# Patient Record
Sex: Female | Born: 1975 | Race: Black or African American | Hispanic: No | Marital: Single | Smoking: Never smoker
Health system: Southern US, Community
[De-identification: ages and names within clinical notes are randomized; demographics above are authoritative.]

## PROBLEM LIST (undated history)

## (undated) DIAGNOSIS — M1990 Unspecified osteoarthritis, unspecified site: Secondary | ICD-10-CM

## (undated) DIAGNOSIS — I10 Essential (primary) hypertension: Secondary | ICD-10-CM

## (undated) DIAGNOSIS — G4730 Sleep apnea, unspecified: Secondary | ICD-10-CM

## (undated) DIAGNOSIS — M549 Dorsalgia, unspecified: Secondary | ICD-10-CM

## (undated) DIAGNOSIS — M2550 Pain in unspecified joint: Secondary | ICD-10-CM

## (undated) DIAGNOSIS — R7303 Prediabetes: Secondary | ICD-10-CM

## (undated) HISTORY — DX: Essential (primary) hypertension: I10

## (undated) HISTORY — DX: Pain in unspecified joint: M25.50

## (undated) HISTORY — PX: DIAGNOSTIC LAPAROSCOPY: SUR761

## (undated) HISTORY — DX: Dorsalgia, unspecified: M54.9

## (undated) HISTORY — PX: BREAST REDUCTION SURGERY: SHX8

## (undated) HISTORY — DX: Prediabetes: R73.03

## (undated) HISTORY — PX: MOUTH SURGERY: SHX715

## (undated) HISTORY — PX: WISDOM TOOTH EXTRACTION: SHX21

---

## 1997-11-09 ENCOUNTER — Inpatient Hospital Stay (HOSPITAL_COMMUNITY): Admission: AD | Admit: 1997-11-09 | Discharge: 1997-11-09 | Payer: Self-pay | Admitting: *Deleted

## 1998-03-04 ENCOUNTER — Inpatient Hospital Stay (HOSPITAL_COMMUNITY): Admission: AD | Admit: 1998-03-04 | Discharge: 1998-03-04 | Payer: Self-pay | Admitting: Obstetrics

## 1998-06-12 ENCOUNTER — Inpatient Hospital Stay (HOSPITAL_COMMUNITY): Admission: AD | Admit: 1998-06-12 | Discharge: 1998-06-12 | Payer: Self-pay | Admitting: Obstetrics & Gynecology

## 1998-11-27 ENCOUNTER — Encounter: Payer: Self-pay | Admitting: Emergency Medicine

## 1998-11-27 ENCOUNTER — Emergency Department (HOSPITAL_COMMUNITY): Admission: EM | Admit: 1998-11-27 | Discharge: 1998-11-27 | Payer: Self-pay | Admitting: Emergency Medicine

## 1998-11-29 ENCOUNTER — Emergency Department (HOSPITAL_COMMUNITY): Admission: EM | Admit: 1998-11-29 | Discharge: 1998-11-29 | Payer: Self-pay | Admitting: Emergency Medicine

## 1999-01-07 ENCOUNTER — Inpatient Hospital Stay (HOSPITAL_COMMUNITY): Admission: AD | Admit: 1999-01-07 | Discharge: 1999-01-07 | Payer: Self-pay | Admitting: Obstetrics & Gynecology

## 1999-04-22 ENCOUNTER — Emergency Department (HOSPITAL_COMMUNITY): Admission: EM | Admit: 1999-04-22 | Discharge: 1999-04-22 | Payer: Self-pay | Admitting: Emergency Medicine

## 1999-04-22 ENCOUNTER — Encounter: Payer: Self-pay | Admitting: Emergency Medicine

## 1999-04-24 ENCOUNTER — Emergency Department (HOSPITAL_COMMUNITY): Admission: EM | Admit: 1999-04-24 | Discharge: 1999-04-24 | Payer: Self-pay | Admitting: Emergency Medicine

## 1999-04-28 ENCOUNTER — Emergency Department (HOSPITAL_COMMUNITY): Admission: EM | Admit: 1999-04-28 | Discharge: 1999-04-29 | Payer: Self-pay | Admitting: Emergency Medicine

## 1999-04-28 ENCOUNTER — Encounter: Payer: Self-pay | Admitting: Emergency Medicine

## 1999-09-26 ENCOUNTER — Inpatient Hospital Stay (HOSPITAL_COMMUNITY): Admission: AD | Admit: 1999-09-26 | Discharge: 1999-09-26 | Payer: Self-pay | Admitting: Obstetrics & Gynecology

## 2000-03-03 ENCOUNTER — Emergency Department (HOSPITAL_COMMUNITY): Admission: EM | Admit: 2000-03-03 | Discharge: 2000-03-03 | Payer: Self-pay | Admitting: Emergency Medicine

## 2000-11-08 ENCOUNTER — Emergency Department (HOSPITAL_COMMUNITY): Admission: EM | Admit: 2000-11-08 | Discharge: 2000-11-08 | Payer: Self-pay | Admitting: Emergency Medicine

## 2001-06-07 ENCOUNTER — Inpatient Hospital Stay (HOSPITAL_COMMUNITY): Admission: AD | Admit: 2001-06-07 | Discharge: 2001-06-07 | Payer: Self-pay | Admitting: Obstetrics & Gynecology

## 2001-10-14 ENCOUNTER — Emergency Department (HOSPITAL_COMMUNITY): Admission: EM | Admit: 2001-10-14 | Discharge: 2001-10-14 | Payer: Self-pay | Admitting: Emergency Medicine

## 2001-10-14 ENCOUNTER — Encounter: Payer: Self-pay | Admitting: Emergency Medicine

## 2002-04-01 ENCOUNTER — Emergency Department (HOSPITAL_COMMUNITY): Admission: EM | Admit: 2002-04-01 | Discharge: 2002-04-01 | Payer: Self-pay | Admitting: Emergency Medicine

## 2002-08-29 HISTORY — PX: SALPINGECTOMY: SHX328

## 2002-12-09 ENCOUNTER — Inpatient Hospital Stay (HOSPITAL_COMMUNITY): Admission: AD | Admit: 2002-12-09 | Discharge: 2002-12-09 | Payer: Self-pay | Admitting: Obstetrics and Gynecology

## 2002-12-10 ENCOUNTER — Encounter: Payer: Self-pay | Admitting: Obstetrics and Gynecology

## 2002-12-30 ENCOUNTER — Ambulatory Visit (HOSPITAL_COMMUNITY): Admission: RE | Admit: 2002-12-30 | Discharge: 2002-12-30 | Payer: Self-pay | Admitting: *Deleted

## 2002-12-30 ENCOUNTER — Encounter: Payer: Self-pay | Admitting: *Deleted

## 2003-01-24 ENCOUNTER — Ambulatory Visit (HOSPITAL_COMMUNITY): Admission: RE | Admit: 2003-01-24 | Discharge: 2003-01-24 | Payer: Self-pay | Admitting: *Deleted

## 2003-01-24 ENCOUNTER — Encounter (INDEPENDENT_AMBULATORY_CARE_PROVIDER_SITE_OTHER): Payer: Self-pay

## 2003-05-30 ENCOUNTER — Encounter: Payer: Self-pay | Admitting: Emergency Medicine

## 2003-05-30 ENCOUNTER — Emergency Department (HOSPITAL_COMMUNITY): Admission: EM | Admit: 2003-05-30 | Discharge: 2003-05-30 | Payer: Self-pay | Admitting: Emergency Medicine

## 2003-10-04 ENCOUNTER — Inpatient Hospital Stay (HOSPITAL_COMMUNITY): Admission: AD | Admit: 2003-10-04 | Discharge: 2003-10-04 | Payer: Self-pay | Admitting: Obstetrics & Gynecology

## 2004-01-26 ENCOUNTER — Inpatient Hospital Stay (HOSPITAL_COMMUNITY): Admission: AD | Admit: 2004-01-26 | Discharge: 2004-01-26 | Payer: Self-pay | Admitting: Obstetrics and Gynecology

## 2005-11-15 ENCOUNTER — Emergency Department (HOSPITAL_COMMUNITY): Admission: EM | Admit: 2005-11-15 | Discharge: 2005-11-15 | Payer: Self-pay | Admitting: Emergency Medicine

## 2006-12-26 ENCOUNTER — Emergency Department (HOSPITAL_COMMUNITY): Admission: EM | Admit: 2006-12-26 | Discharge: 2006-12-26 | Payer: Self-pay | Admitting: Emergency Medicine

## 2007-02-27 ENCOUNTER — Emergency Department (HOSPITAL_COMMUNITY): Admission: EM | Admit: 2007-02-27 | Discharge: 2007-02-27 | Payer: Self-pay | Admitting: Emergency Medicine

## 2007-03-19 ENCOUNTER — Emergency Department (HOSPITAL_COMMUNITY): Admission: EM | Admit: 2007-03-19 | Discharge: 2007-03-20 | Payer: Self-pay | Admitting: Emergency Medicine

## 2007-07-05 ENCOUNTER — Emergency Department (HOSPITAL_COMMUNITY): Admission: EM | Admit: 2007-07-05 | Discharge: 2007-07-05 | Payer: Self-pay | Admitting: Emergency Medicine

## 2007-07-11 ENCOUNTER — Inpatient Hospital Stay (HOSPITAL_COMMUNITY): Admission: AD | Admit: 2007-07-11 | Discharge: 2007-07-12 | Payer: Self-pay | Admitting: Obstetrics & Gynecology

## 2007-08-02 ENCOUNTER — Emergency Department (HOSPITAL_COMMUNITY): Admission: EM | Admit: 2007-08-02 | Discharge: 2007-08-02 | Payer: Self-pay | Admitting: Emergency Medicine

## 2007-08-06 ENCOUNTER — Encounter: Admission: RE | Admit: 2007-08-06 | Discharge: 2007-08-06 | Payer: Self-pay | Admitting: Family Medicine

## 2007-09-23 ENCOUNTER — Emergency Department (HOSPITAL_COMMUNITY): Admission: EM | Admit: 2007-09-23 | Discharge: 2007-09-23 | Payer: Self-pay | Admitting: Family Medicine

## 2008-01-02 ENCOUNTER — Emergency Department (HOSPITAL_COMMUNITY): Admission: EM | Admit: 2008-01-02 | Discharge: 2008-01-02 | Payer: Self-pay | Admitting: Family Medicine

## 2008-06-14 IMAGING — MG MM DIAGNOSTIC UNILATERAL L
7 series · 7 of 7 positions shown · non-contrast
Comparison: none

DG DIAGNOSTIC BILATERAL
Bilateral CC and MLO view(s) were taken.

LEFT BREAST ULTRASOUND
DIGITAL BILATERAL  DIAGNOSTIC MAMMOGRAM WITH CAD AND LEFT BREAST ULTRASOUND:
CLINICAL DATA: Questionable mass, left breast.  Previous breast reduction surgery.

[L CC]
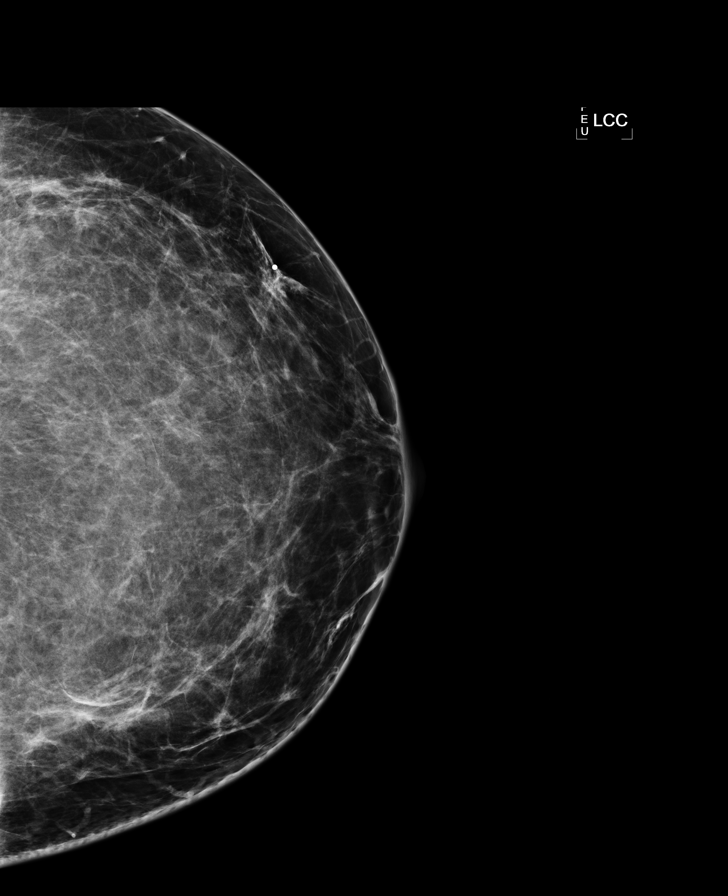

[L MLO (1 of 2)]
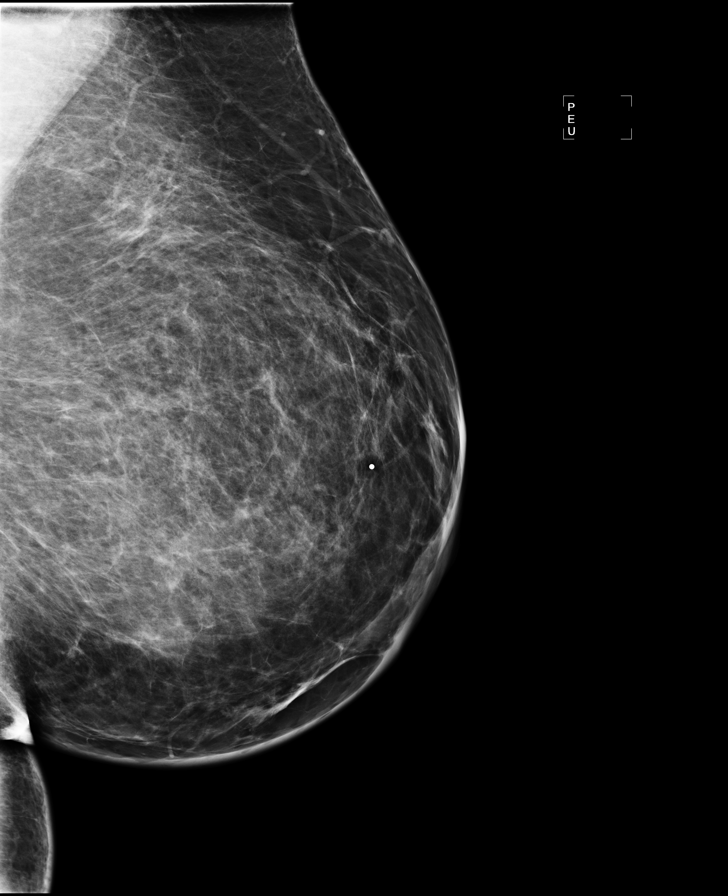

[R CC]
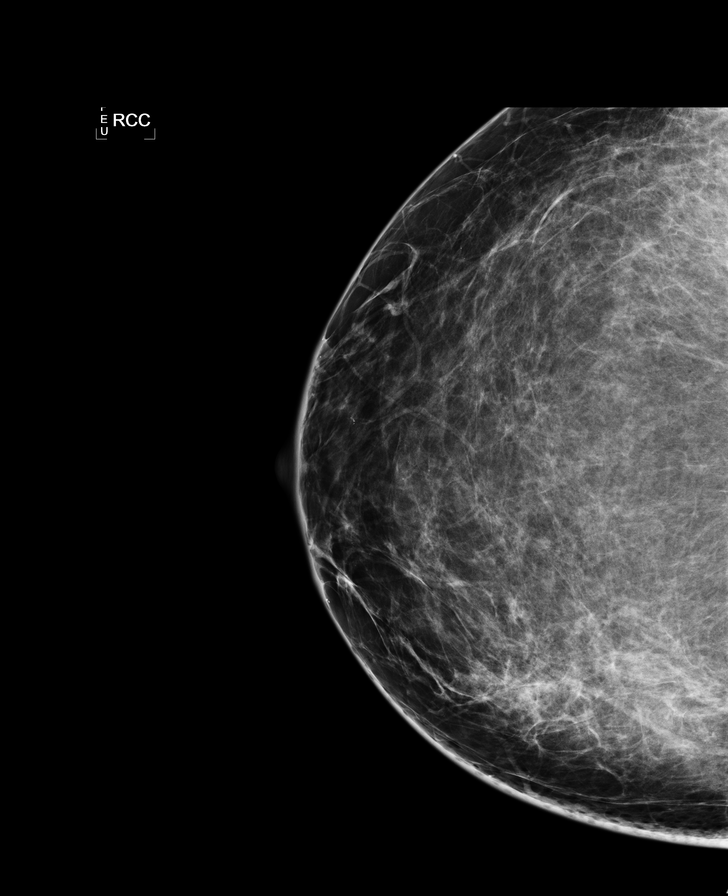

[R MLO (1 of 2)]
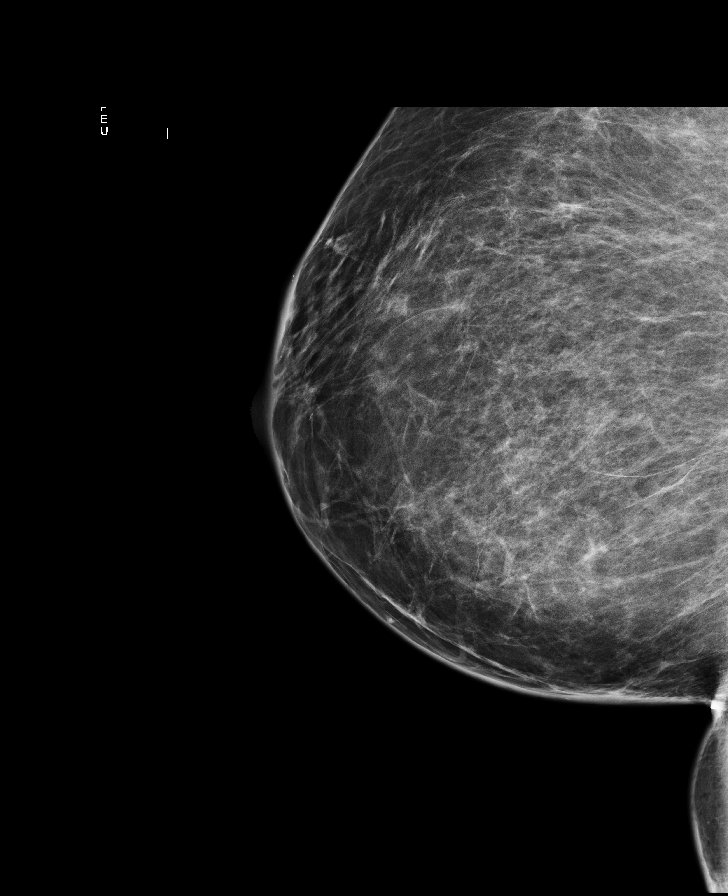

[L TAN]
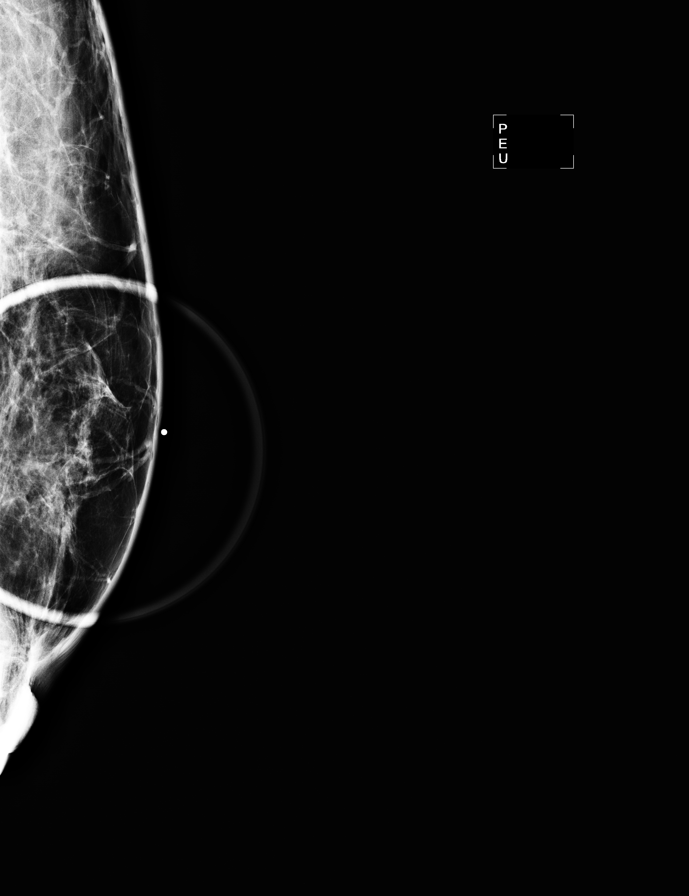

[L MLO (2 of 2)]
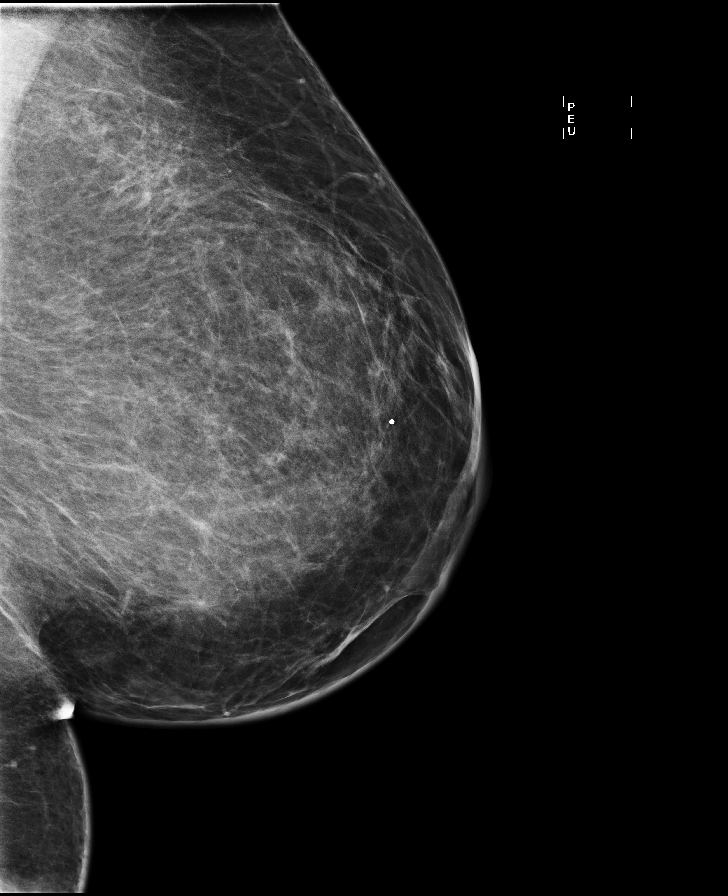

[R MLO (2 of 2)]
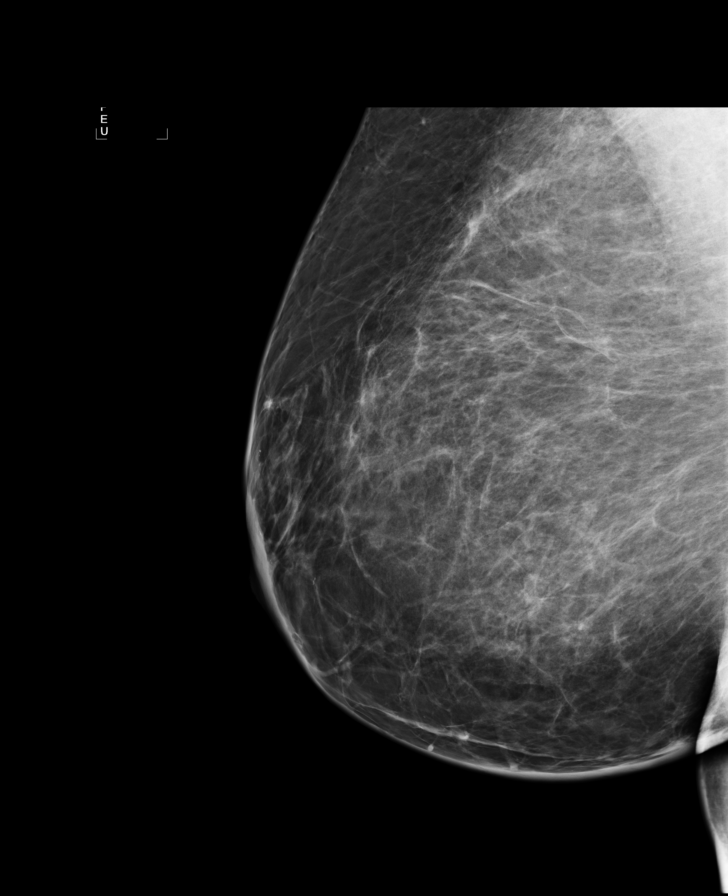

[7 of 7 positions shown; findings below may reference images not displayed]

There is a fibrofatty parenchymal pattern present.  There are mild scarring changes within the 
breast related to the patient's previous breast reduction surgery.  There is no evidence for a mass
and there are no suspicious microcalcifications.

On my directed physical examination of the area of questionable palpable abnormality within the 
left breast located approximately at the 3-4 o'clock position there was no discrete palpable mass. 
Ultrasound demonstrates no mass, cyst, distortion or abnormal acoustic shadowing in this region.
IMPRESSION: No specific evidence for malignancy.  Recommend annual screening mammography at age 41.  The 
importance of breast self examination was stressed with the patient.

ASSESSMENT: Negative - BI-RADS 1

Routine screening mammogram at age 41.
ANALYZED BY COMPUTER AIDED DETECTION. ,

## 2009-05-14 ENCOUNTER — Emergency Department (HOSPITAL_COMMUNITY): Admission: EM | Admit: 2009-05-14 | Discharge: 2009-05-14 | Payer: Self-pay | Admitting: Emergency Medicine

## 2009-06-24 ENCOUNTER — Other Ambulatory Visit: Admission: RE | Admit: 2009-06-24 | Discharge: 2009-06-24 | Payer: Self-pay | Admitting: *Deleted

## 2009-09-25 ENCOUNTER — Encounter: Admission: RE | Admit: 2009-09-25 | Discharge: 2009-09-25 | Payer: Self-pay | Admitting: *Deleted

## 2009-11-30 ENCOUNTER — Inpatient Hospital Stay (HOSPITAL_COMMUNITY): Admission: AD | Admit: 2009-11-30 | Discharge: 2009-11-30 | Payer: Self-pay | Admitting: Obstetrics and Gynecology

## 2010-04-15 ENCOUNTER — Other Ambulatory Visit: Admission: RE | Admit: 2010-04-15 | Discharge: 2010-04-15 | Payer: Self-pay | Admitting: Obstetrics and Gynecology

## 2010-10-03 ENCOUNTER — Emergency Department (HOSPITAL_COMMUNITY)
Admission: EM | Admit: 2010-10-03 | Discharge: 2010-10-04 | Disposition: A | Discharge status: Home / Self Care | Payer: PRIVATE HEALTH INSURANCE | Attending: Emergency Medicine | Admitting: Emergency Medicine

## 2010-10-03 DIAGNOSIS — W268XXA Contact with other sharp object(s), not elsewhere classified, initial encounter: Secondary | ICD-10-CM | POA: Insufficient documentation

## 2010-10-03 DIAGNOSIS — S71109A Unspecified open wound, unspecified thigh, initial encounter: Secondary | ICD-10-CM | POA: Insufficient documentation

## 2010-10-03 DIAGNOSIS — Y929 Unspecified place or not applicable: Secondary | ICD-10-CM | POA: Insufficient documentation

## 2010-10-03 DIAGNOSIS — S71009A Unspecified open wound, unspecified hip, initial encounter: Secondary | ICD-10-CM | POA: Insufficient documentation

## 2010-11-17 LAB — POCT PREGNANCY, URINE: Preg Test, Ur: NEGATIVE

## 2010-11-17 LAB — URINE MICROSCOPIC-ADD ON

## 2010-11-17 LAB — GC/CHLAMYDIA PROBE AMP, GENITAL
Chlamydia, DNA Probe: NEGATIVE
GC Probe Amp, Genital: NEGATIVE

## 2010-11-17 LAB — URINALYSIS, ROUTINE W REFLEX MICROSCOPIC
Leukocytes, UA: NEGATIVE
Nitrite: NEGATIVE
Protein, ur: NEGATIVE mg/dL
Urobilinogen, UA: 0.2 mg/dL (ref 0.0–1.0)

## 2010-11-17 LAB — WET PREP, GENITAL
Trich, Wet Prep: NONE SEEN
Yeast Wet Prep HPF POC: NONE SEEN

## 2010-11-17 LAB — CBC
HCT: 34.6 % — ABNORMAL LOW (ref 36.0–46.0)
Platelets: 381 10*3/uL (ref 150–400)
WBC: 8.6 10*3/uL (ref 4.0–10.5)

## 2010-12-20 ENCOUNTER — Inpatient Hospital Stay (HOSPITAL_COMMUNITY): Payer: PRIVATE HEALTH INSURANCE

## 2010-12-20 ENCOUNTER — Inpatient Hospital Stay (HOSPITAL_COMMUNITY)
Admission: AD | Admit: 2010-12-20 | Discharge: 2010-12-20 | Disposition: A | Discharge status: Home / Self Care | Payer: PRIVATE HEALTH INSURANCE | Source: Ambulatory Visit | Attending: Family Medicine | Admitting: Family Medicine

## 2010-12-20 DIAGNOSIS — O209 Hemorrhage in early pregnancy, unspecified: Secondary | ICD-10-CM

## 2010-12-20 DIAGNOSIS — D259 Leiomyoma of uterus, unspecified: Secondary | ICD-10-CM

## 2010-12-20 DIAGNOSIS — O3410 Maternal care for benign tumor of corpus uteri, unspecified trimester: Secondary | ICD-10-CM

## 2010-12-20 DIAGNOSIS — R109 Unspecified abdominal pain: Secondary | ICD-10-CM

## 2010-12-20 LAB — URINE MICROSCOPIC-ADD ON

## 2010-12-20 LAB — URINALYSIS, ROUTINE W REFLEX MICROSCOPIC
Bilirubin Urine: NEGATIVE
Glucose, UA: NEGATIVE mg/dL
Specific Gravity, Urine: 1.01 (ref 1.005–1.030)
pH: 6.5 (ref 5.0–8.0)

## 2010-12-21 LAB — GC/CHLAMYDIA PROBE AMP, GENITAL
Chlamydia, DNA Probe: NEGATIVE
GC Probe Amp, Genital: NEGATIVE

## 2011-01-14 NOTE — Op Note (Signed)
Mary Ray, Mary Ray                        ACCOUNT NO.:  [id number]   MEDICAL RECORD NO.:  [id number]                   PATIENT TYPE:  AMB   LOCATION:  SDC                                  FACILITY:  [facility]   PHYSICIAN:  [place] B. Earlene Plater, M.D.               DATE OF BIRTH:  Jan 19, 1976   DATE OF PROCEDURE:  01/24/2003  DATE OF DISCHARGE:                                 OPERATIVE REPORT   PREOPERATIVE DIAGNOSES:  1. Pelvic adhesions.  2. Pelvic pain.   POSTOPERATIVE DIAGNOSES:  1. Pelvic adhesions.  2. Pelvic pain.   PROCEDURES:  1. Open laparoscopy.  2. Chromopertubation.  3. Left salpingectomy.   ANESTHESIA:  General.   SURGEON:  [place] B. Earlene Plater, M.D.   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  Adhesions in the right gutter  involving the cecum and distal small bowel.  The appendix not well-  visualized due to adhesions.  The right tube showed normal spill and normal-  appearing ovaries bilaterally, otherwise normal-appearing pelvis.   ESTIMATED BLOOD LOSS:  6 mL.   COMPLICATIONS:  None.   SPECIMENS:  Left hydrosalpinx.   INDICATIONS:  Patient with a history of pelvic pain and pelvic adhesions  suggested on HSG and blockage of the left tube.  Patient interested in  treatment of her pain and any surgery that might be needed in terms of  fertility.   DESCRIPTION OF PROCEDURE:  The patient was taken to the operating room and  general anesthesia obtained.  She was placed in the ski position and prepped  and draped in a standard fashion.  The bladder emptied with a red rubber  catheter.  Speculum inserted and the acorn cannula attached and the single-  tooth tenaculum used to stabilize it.   A vertical incision made in the umbilicus and carried sharply to the  underlying fascia.  The fascia was elevated and divided sharply.  The  posterior peritoneum entered sharply.  A pursestring suture of 0 Vicryl  placed around the fascial defect, Hasson cannula inserted and  secured,  pneumoperitoneum obtained with CO2 gas.  A 5 mm port was placed in the lower  abdomen in the midline and left lower quadrant using direct laparoscopic  visualization.   Trendelenburg position obtained, bowel mobilized superiorly.  The pelvis was  inspected with the above findings noted.  Chromopertubation performed.  The  right tube immediately spilled.  The left tube would not spill and was  grossly dilated.  It was inspected and attempts made at fimbrioplasty;  however, the fimbriae were completely phimosed and could not be opened.  In  my opinion, this hydrosalpinx put the patient at risk for recurrent TOAs,  ectopic pregnancy, and ongoing infertility.  Therefore, I proceeded with  salpingectomy.   The tripolar was used to cauterize the tube, and it was excised sharply to  the level just distal to the uterine cornu.  The  line of resection was  hemostatic.  The adhesions in the right lower quadrant involved the bowel  such that I did not feel secure moving ahead with a lysis of adhesions of  this area due to concern for potential bowel injury.  Therefore, these were  left untreated.   As no other repairable abnormalities were noted, the procedure was  discontinued.   The lower quadrant ports were removed under direct laparoscopic  visualization.  The sites were hemostatic.   The scope was removed, gas released, scope and Hasson cannula removed.  I  inserted my index finger through the fascial defect, snugged down the  pursestring suture.  This obliterated the fascial defect.  No intra-  abdominal contents herniated through prior to closure.  The skin at each  site was closed with subcuticular 4-0 Vicryl.   The patient tolerated the procedure well without any complications.  She was  taken to the recovery room awake and alert, in stable condition.                                                Gerri Spore B. Earlene Plater, M.D.    WBD/MEDQ  D:  01/24/2003  T:  01/24/2003  Job:   [id number]

## 2011-01-14 NOTE — H&P (Signed)
NAMEWILLIETTE, Mary Ray                        ACCOUNT NO.:  [id number]   MEDICAL RECORD NO.:  [id number]                   PATIENT TYPE:  AMB   LOCATION:  SDC                                  FACILITY:  [facility]   PHYSICIAN:  [place] B. Earlene Plater, M.D.               DATE OF BIRTH:  1976/07/11   DATE OF ADMISSION:  DATE OF DISCHARGE:                                HISTORY & PHYSICAL   PREOPERATIVE DIAGNOSIS:  Pelvic adhesions.   INTENDED PROCEDURE:  Open laparoscopy and lysis of adhesions.   HISTORY OF PRESENT ILLNESS:  A 35 year old African-American female with a  several month history of right lower quadrant pain and low back pain  increasing in the recent month.  It is typically on the right side and  present for about a year.  The patient describes it as moderate to severe  burning, lasting 5 to 10 minutes, three times a day.  There is some increase  in symptoms with meals.  No constipation, diarrhea, mucus, or blood in  stools.  She has had no fever or weight loss and no urinary symptoms.  She  has no history of dysmenorrhea or dyspareunia.   In addition, the patient has been trying to conceive for over a year and  recent hysterosalpingogram was suggestive of pelvic adhesions and  questionable blockage on the left.  Given the history of pelvic pain and  suggestion of pelvic adhesions, the patient presents for surgical diagnosis  and potential treatment.   PAST MEDICAL HISTORY:  She has history of gonorrhea.   PAST SURGICAL HISTORY:  None.   MEDICATIONS:  None.   ALLERGIES:  VIOXX causes hives.   HABITS:  Occasional alcohol.  No tobacco or other drugs.   FAMILY HISTORY:  Diabetes and hypertension and died after head trauma from a  motor vehicle accident.   REVIEW OF SYSTEMS:  Otherwise negative.   PHYSICAL EXAMINATION:  GENERAL APPEARANCE:  Alert and oriented, in no acute  distress.  SKIN:  Warm and dry without lesions.  VITAL SIGNS:  Blood pressure 110/78, pulse 68,  weight 290.  NECK:  Supple without thyromegaly.  LUNGS:  Clear to auscultation.  CARDIOVASCULAR:  Regular rate and rhythm.  ABDOMEN:  Obese.  Liver and spleen are normal.  No hernia.  LYMPH NODES:  Negative neck, axilla and groin.  PELVIC:  Normal external genitalia.  Vagina and cervix normal.  Uterus  difficult to palpate due to body habitus.  No adnexal masses or tenderness.   ASSESSMENT:  History of pelvic pain, probable pelvic adhesions.   PLAN:  Open laparoscopy with lysis of adhesions.  Will also perform  chromopertubation given a question of tubal spill on recent history of  salpingogram.   Operative risks were discussed including infection, bleeding and damage to  surrounding organs.  All questions answered and the patient wishes to  proceed.  Gerri Spore B. Earlene Plater, M.D.    WBD/MEDQ  D:  01/20/2003  T:  01/20/2003  Job:  [id number]

## 2011-01-27 ENCOUNTER — Inpatient Hospital Stay (HOSPITAL_COMMUNITY)
Admission: AD | Admit: 2011-01-27 | Discharge: 2011-01-28 | Disposition: A | Discharge status: Home / Self Care | Payer: PRIVATE HEALTH INSURANCE | Source: Ambulatory Visit | Attending: Obstetrics and Gynecology | Admitting: Obstetrics and Gynecology

## 2011-01-27 DIAGNOSIS — O210 Mild hyperemesis gravidarum: Secondary | ICD-10-CM

## 2011-01-28 LAB — URINALYSIS, ROUTINE W REFLEX MICROSCOPIC
Bilirubin Urine: NEGATIVE
Ketones, ur: 15 mg/dL — AB
Nitrite: NEGATIVE
Urobilinogen, UA: 0.2 mg/dL (ref 0.0–1.0)
pH: 5.5 (ref 5.0–8.0)

## 2011-01-28 LAB — URINE MICROSCOPIC-ADD ON

## 2011-02-08 LAB — RUBELLA ANTIBODY, IGM: Rubella: IMMUNE

## 2011-02-08 LAB — ANTIBODY SCREEN: Antibody Screen: NEGATIVE

## 2011-02-08 LAB — CBC
HCT: 38 % (ref 36–46)
Hemoglobin: 12.2 g/dL (ref 12.0–16.0)
Platelets: 365 10*3/uL (ref 150–399)

## 2011-02-25 ENCOUNTER — Other Ambulatory Visit: Payer: Self-pay | Admitting: Obstetrics and Gynecology

## 2011-02-25 DIAGNOSIS — N644 Mastodynia: Secondary | ICD-10-CM

## 2011-03-01 ENCOUNTER — Other Ambulatory Visit: Payer: Self-pay | Admitting: Obstetrics and Gynecology

## 2011-03-01 ENCOUNTER — Ambulatory Visit: Payer: PRIVATE HEALTH INSURANCE

## 2011-03-01 ENCOUNTER — Ambulatory Visit
Admission: RE | Admit: 2011-03-01 | Discharge: 2011-03-01 | Disposition: A | Discharge status: Home / Self Care | Payer: PRIVATE HEALTH INSURANCE | Source: Ambulatory Visit | Attending: Obstetrics and Gynecology | Admitting: Obstetrics and Gynecology

## 2011-03-01 DIAGNOSIS — N644 Mastodynia: Secondary | ICD-10-CM

## 2011-03-02 ENCOUNTER — Inpatient Hospital Stay (HOSPITAL_COMMUNITY)
Admission: AD | Admit: 2011-03-02 | Discharge: 2011-03-02 | Disposition: A | Discharge status: Home / Self Care | Payer: PRIVATE HEALTH INSURANCE | Source: Ambulatory Visit | Attending: Obstetrics and Gynecology | Admitting: Obstetrics and Gynecology

## 2011-03-02 DIAGNOSIS — R221 Localized swelling, mass and lump, neck: Secondary | ICD-10-CM | POA: Insufficient documentation

## 2011-03-02 DIAGNOSIS — R220 Localized swelling, mass and lump, head: Secondary | ICD-10-CM | POA: Insufficient documentation

## 2011-03-02 DIAGNOSIS — O99891 Other specified diseases and conditions complicating pregnancy: Secondary | ICD-10-CM | POA: Insufficient documentation

## 2011-03-02 DIAGNOSIS — O9989 Other specified diseases and conditions complicating pregnancy, childbirth and the puerperium: Secondary | ICD-10-CM

## 2011-03-02 DIAGNOSIS — T368X5A Adverse effect of other systemic antibiotics, initial encounter: Secondary | ICD-10-CM | POA: Insufficient documentation

## 2011-03-12 ENCOUNTER — Encounter (HOSPITAL_COMMUNITY): Payer: Self-pay | Admitting: *Deleted

## 2011-03-12 ENCOUNTER — Inpatient Hospital Stay (HOSPITAL_COMMUNITY)
Admission: AD | Admit: 2011-03-12 | Discharge: 2011-03-12 | Disposition: A | Discharge status: Home / Self Care | Payer: PRIVATE HEALTH INSURANCE | Source: Ambulatory Visit | Attending: Obstetrics and Gynecology | Admitting: Obstetrics and Gynecology

## 2011-03-12 DIAGNOSIS — R109 Unspecified abdominal pain: Secondary | ICD-10-CM

## 2011-03-12 DIAGNOSIS — O210 Mild hyperemesis gravidarum: Secondary | ICD-10-CM | POA: Insufficient documentation

## 2011-03-12 DIAGNOSIS — O219 Vomiting of pregnancy, unspecified: Secondary | ICD-10-CM

## 2011-03-12 DIAGNOSIS — R748 Abnormal levels of other serum enzymes: Secondary | ICD-10-CM

## 2011-03-12 DIAGNOSIS — O26899 Other specified pregnancy related conditions, unspecified trimester: Secondary | ICD-10-CM

## 2011-03-12 LAB — CBC
HCT: 33.7 % — ABNORMAL LOW (ref 36.0–46.0)
MCHC: 33.8 g/dL (ref 30.0–36.0)
RDW: 14.3 % (ref 11.5–15.5)
WBC: 11.3 10*3/uL — ABNORMAL HIGH (ref 4.0–10.5)

## 2011-03-12 LAB — COMPREHENSIVE METABOLIC PANEL
BUN: 6 mg/dL (ref 6–23)
CO2: 19 mEq/L (ref 19–32)
Calcium: 9.2 mg/dL (ref 8.4–10.5)
Chloride: 102 mEq/L (ref 96–112)
Creatinine, Ser: 0.58 mg/dL (ref 0.50–1.10)
GFR calc Af Amer: >60 mL/min (ref >60)
GFR calc non Af Amer: >60 mL/min (ref >60)
Glucose, Bld: 127 mg/dL — ABNORMAL HIGH (ref 70–99)
Total Bilirubin: 0.3 mg/dL (ref 0.3–1.2)

## 2011-03-12 LAB — LIPASE, BLOOD: Lipase: 36 U/L (ref 11–59)

## 2011-03-12 LAB — AMYLASE: Amylase: 53 U/L (ref 0–105)

## 2011-03-12 MED ORDER — GI COCKTAIL ~~LOC~~
30.0000 mL | Freq: Once | ORAL | Status: AC
  Administered 2011-03-12: 30 mL via ORAL
  Filled 2011-03-12: qty 30

## 2011-03-12 MED ORDER — PROMETHAZINE HCL 25 MG PO TABS: 25.0000 mg | ORAL_TABLET | Freq: Four times a day (QID) | ORAL | Status: AC | PRN

## 2011-03-12 MED ORDER — HYDROMORPHONE HCL 1 MG/ML IJ SOLN
1.0000 mg | Freq: Once | INTRAMUSCULAR | Status: AC
  Administered 2011-03-12: 1 mg via INTRAVENOUS

## 2011-03-12 MED ORDER — ONDANSETRON HCL 4 MG PO TABS: 8.0000 mg | ORAL_TABLET | Freq: Four times a day (QID) | ORAL | Status: AC

## 2011-03-12 MED ORDER — FAMOTIDINE IN NACL 20-0.9 MG/50ML-% IV SOLN
20.0000 mg | Freq: Once | INTRAVENOUS | Status: AC
  Administered 2011-03-12: 03:00:00 via INTRAVENOUS
  Filled 2011-03-12: qty 50

## 2011-03-12 MED ORDER — ONDANSETRON HCL 4 MG/2ML IJ SOLN
4.0000 mg | Freq: Once | INTRAMUSCULAR | Status: AC
  Administered 2011-03-12: 4 mg via INTRAVENOUS
  Filled 2011-03-12: qty 2

## 2011-03-12 MED ORDER — LACTATED RINGERS IV SOLN
INTRAVENOUS | Status: DC
  Administered 2011-03-12 (×2): via INTRAVENOUS

## 2011-03-12 MED ORDER — HYDROMORPHONE HCL 1 MG/ML IJ SOLN
INTRAMUSCULAR | Status: AC
  Administered 2011-03-12: 1 mg via INTRAVENOUS
  Filled 2011-03-12: qty 1

## 2011-03-12 NOTE — ED Provider Notes (Signed)
History     Chief Complaint  Patient presents with  . Abdominal Pain   HPI  Awakened with upper abdominal pain at 0100.  Ate at 9 pm and had fried chicken, mashed potatoes, dirty rice, biscuit.  OB History    Grav Para Term Preterm Abortions TAB SAB Ect Mult Living   1               Past Medical History  Diagnosis Date  . No pertinent past medical history     Past Surgical History  Procedure Date  . Breast reduction surgery   . Salpingectomy     No family history on file.  History  Substance Use Topics  . Smoking status: Never Smoker   . Smokeless tobacco: Not on file  . Alcohol Use: No    Allergies:  Allergies  Allergen Reactions  . Clindamycin/Lincomycin Swelling    Facial swelling    Prescriptions prior to admission  Medication Sig Dispense Refill  . prenatal vitamin w/FE, FA (PRENATAL 1 + 1) 27-1 MG TABS Take 1 tablet by mouth at bedtime.          Review of Systems  Gastrointestinal: Positive for abdominal pain. Negative for nausea and vomiting.  Genitourinary:       No vaginal bleeding. No lower abdominal pain.   Physical Exam   Blood pressure 143/71, pulse 95, temperature 98.7 F (37.1 C), temperature source Oral, resp. rate 22, height 5\' 7"  (1.702 m), weight 320 lb (145.151 kg), last menstrual period 11/10/2010.  Physical Exam  Vitals reviewed. Constitutional: She is oriented to person, place, and time. She appears well-developed.       Morbidly obese, seems uncomfortable, sitting upright in bed  HENT:  Head: Normocephalic.  Eyes: EOM are normal.  Neck: Neck supple.  GI: Soft. Bowel sounds are normal. There is no rebound and no guarding.       Pain all across upper abdomen, no point tenderness  Musculoskeletal: Normal range of motion.  Neurological: She is alert and oriented to person, place, and time.  Skin: Skin is warm and dry.    MAU Course  Procedures  MDM  Consult: Dr. Rana Snare - reviewed plan of care   0213  Results for orders  placed during the hospital encounter of 03/12/11 (from the past 24 hour(s))  AMYLASE     Status: Normal   Collection Time   03/12/11  3:35 AM      Component Value Range   Amylase 53  0 - 105 (U/L)      *Note: This is not all of the results for the requested time period. They were limited due to a high amount of results. Please view the rest in Results Review.   Lab Results  Component Value Date   ALT 71* 03/12/2011   AST 161* 03/12/2011   ALKPHOS 70 03/12/2011   BILITOT 0.3 03/12/2011    Assessment: Upper abdominal pain Elevated liver enzymes Nausea and vomiting in pregnancy, 17 weeks  Plan: GI cocktail If pain resolves, will send home to follow up in the office. Attempted to take PO Gi Cocktail, but began vomiting.   Started IVF LR at 500 CC/hr and will give zofran 4 mg IVpush and Pepcid 20 mg IV piggyback  Client vomited large amount several times and continued to have pain. Gave Dilaudid 1 mg IV for pain. At 0645 am, client feeling much better Consult with Dr. Rana Snare To go home and be seen in the office  on Monday.  Nolene Bernheim, NP 03/12/11 1610  Nolene Bernheim, NP 03/12/11 [phone]

## 2011-03-12 NOTE — Progress Notes (Signed)
Pt reports she was awakened from sleep about 30 minutes ago with sharp , constant, upper abd pain. Denies nausea, vomiting

## 2011-03-15 ENCOUNTER — Other Ambulatory Visit (HOSPITAL_COMMUNITY): Payer: Self-pay | Admitting: Obstetrics and Gynecology

## 2011-03-15 DIAGNOSIS — R1011 Right upper quadrant pain: Secondary | ICD-10-CM

## 2011-03-16 ENCOUNTER — Ambulatory Visit (HOSPITAL_COMMUNITY)
Admission: RE | Admit: 2011-03-16 | Discharge: 2011-03-16 | Disposition: A | Discharge status: Home / Self Care | Payer: PRIVATE HEALTH INSURANCE | Source: Ambulatory Visit | Attending: Obstetrics and Gynecology | Admitting: Obstetrics and Gynecology

## 2011-03-16 DIAGNOSIS — O99891 Other specified diseases and conditions complicating pregnancy: Secondary | ICD-10-CM | POA: Insufficient documentation

## 2011-03-16 DIAGNOSIS — R1011 Right upper quadrant pain: Secondary | ICD-10-CM | POA: Insufficient documentation

## 2011-03-22 ENCOUNTER — Other Ambulatory Visit: Payer: Self-pay | Admitting: Obstetrics and Gynecology

## 2011-03-22 ENCOUNTER — Ambulatory Visit
Admission: RE | Admit: 2011-03-22 | Discharge: 2011-03-22 | Disposition: A | Discharge status: Home / Self Care | Payer: PRIVATE HEALTH INSURANCE | Source: Ambulatory Visit | Attending: Obstetrics and Gynecology | Admitting: Obstetrics and Gynecology

## 2011-03-22 DIAGNOSIS — N644 Mastodynia: Secondary | ICD-10-CM

## 2011-04-03 ENCOUNTER — Encounter (HOSPITAL_COMMUNITY): Payer: Self-pay | Admitting: *Deleted

## 2011-04-03 ENCOUNTER — Inpatient Hospital Stay (HOSPITAL_COMMUNITY)
Admission: AD | Admit: 2011-04-03 | Discharge: 2011-04-04 | Disposition: A | Discharge status: Home / Self Care | Payer: PRIVATE HEALTH INSURANCE | Source: Ambulatory Visit | Attending: Obstetrics and Gynecology | Admitting: Obstetrics and Gynecology

## 2011-04-03 DIAGNOSIS — R109 Unspecified abdominal pain: Secondary | ICD-10-CM | POA: Insufficient documentation

## 2011-04-03 DIAGNOSIS — N949 Unspecified condition associated with female genital organs and menstrual cycle: Secondary | ICD-10-CM

## 2011-04-03 LAB — URINALYSIS, ROUTINE W REFLEX MICROSCOPIC
Nitrite: NEGATIVE
Protein, ur: NEGATIVE mg/dL
Specific Gravity, Urine: 1.01 (ref 1.005–1.030)
Urobilinogen, UA: 0.2 mg/dL (ref 0.0–1.0)

## 2011-04-03 LAB — URINE MICROSCOPIC-ADD ON

## 2011-04-03 NOTE — Progress Notes (Signed)
Pt reports lower back pain, rt lower abd pain x 8 hours. Denies bleeding, denies dysuria

## 2011-04-04 ENCOUNTER — Encounter (HOSPITAL_COMMUNITY): Payer: Self-pay | Admitting: Family

## 2011-04-04 NOTE — ED Provider Notes (Signed)
History     Chief Complaint  Patient presents with  . Abdominal Pain   HPI  Reports right groin pain that increases with movement.  Denies UTI symptoms, abnormal vaginal discharge,  vaginal bleeding or leaking of fluid.  No recent intercourse.    Past Medical History  Diagnosis Date  . No pertinent past medical history     Past Surgical History  Procedure Date  . Breast reduction surgery   . Salpingectomy 2004    Scar tissue???    No family history on file.  History  Substance Use Topics  . Smoking status: Never Smoker   . Smokeless tobacco: Not on file  . Alcohol Use: No    Allergies:  Allergies  Allergen Reactions  . Clindamycin/Lincomycin Swelling    Facial swelling    Prescriptions prior to admission  Medication Sig Dispense Refill  . prenatal vitamin w/FE, FA (PRENATAL 1 + 1) 27-1 MG TABS Take 1 tablet by mouth at bedtime.          ROS Negative except for what is listed in HPI.  Physical Exam   Blood pressure 135/69, pulse 85, temperature 98.9 F (37.2 C), temperature source Oral, resp. rate 18, height 5\' 7"  (1.702 m), weight 151.501 kg (334 lb), last menstrual period 11/10/2010.  Physical Exam  Constitutional: She is oriented to person, place, and time. She appears well-developed and well-nourished.  HENT:  Head: Normocephalic.  Neck: Normal range of motion. Neck supple.  Cardiovascular: Normal rate, regular rhythm and normal heart sounds.   Respiratory: Effort normal and breath sounds normal.  Genitourinary: No bleeding around the vagina. Vaginal discharge (mucusy) found.       Cervix closed  Neurological: She is alert and oriented to person, place, and time. She has normal reflexes.  Skin: Skin is warm and dry.    MAU Course  Procedures    Assessment and Plan  Round Ligament Pain  DC Home  [facility] 04/04/2011, 12:35 AM

## 2011-04-04 NOTE — Consult Note (Cosign Needed)
Reviewed HPI/Exam>DC to home

## 2011-04-12 ENCOUNTER — Ambulatory Visit
Admission: RE | Admit: 2011-04-12 | Discharge: 2011-04-12 | Disposition: A | Discharge status: Home / Self Care | Payer: PRIVATE HEALTH INSURANCE | Source: Ambulatory Visit | Attending: Obstetrics and Gynecology | Admitting: Obstetrics and Gynecology

## 2011-04-12 ENCOUNTER — Other Ambulatory Visit: Payer: PRIVATE HEALTH INSURANCE

## 2011-04-12 DIAGNOSIS — N644 Mastodynia: Secondary | ICD-10-CM

## 2011-05-06 ENCOUNTER — Inpatient Hospital Stay (HOSPITAL_COMMUNITY)
Admission: AD | Admit: 2011-05-06 | Discharge: 2011-05-06 | Disposition: A | Discharge status: Home / Self Care | Payer: BC Managed Care – PPO | Source: Ambulatory Visit | Attending: Obstetrics and Gynecology | Admitting: Obstetrics and Gynecology

## 2011-05-06 ENCOUNTER — Encounter (HOSPITAL_COMMUNITY): Payer: Self-pay | Admitting: *Deleted

## 2011-05-06 DIAGNOSIS — O9989 Other specified diseases and conditions complicating pregnancy, childbirth and the puerperium: Secondary | ICD-10-CM | POA: Insufficient documentation

## 2011-05-06 DIAGNOSIS — N949 Unspecified condition associated with female genital organs and menstrual cycle: Secondary | ICD-10-CM

## 2011-05-06 LAB — URINALYSIS, ROUTINE W REFLEX MICROSCOPIC
Glucose, UA: NEGATIVE mg/dL
Ketones, ur: NEGATIVE mg/dL
Leukocytes, UA: NEGATIVE
Protein, ur: NEGATIVE mg/dL
pH: 6 (ref 5.0–8.0)

## 2011-05-06 LAB — URINE MICROSCOPIC-ADD ON

## 2011-05-06 NOTE — ED Provider Notes (Signed)
History     Chief Complaint  Patient presents with  . Abdominal Pain   HPI  Pt here with report of lower pelvic pain.  Pain is described as sharp and rated a 8/10.  Pain increases with movement and walking.  Denies vaginal bleeding, abnormal discharge, or leaking of fluid.  +fetal movement.    Past Medical History  Diagnosis Date  . No pertinent past medical history     Past Surgical History  Procedure Date  . Breast reduction surgery   . Salpingectomy 2004    Scar tissue???    No family history on file.  History  Substance Use Topics  . Smoking status: Never Smoker   . Smokeless tobacco: Never Used  . Alcohol Use: No    Allergies:  Allergies  Allergen Reactions  . Clindamycin/Lincomycin Swelling    Facial swelling    Prescriptions prior to admission  Medication Sig Dispense Refill  . prenatal vitamin w/FE, FA (PRENATAL 1 + 1) 27-1 MG TABS Take 1 tablet by mouth at bedtime.         Review of Systems  Gastrointestinal: Positive for abdominal pain.  All other systems reviewed and are negative.   Physical Exam   Blood pressure 135/63, pulse 94, temperature 98.6 F (37 C), temperature source Oral, resp. rate 18, height 5\' 8"  (1.727 m), weight 151.501 kg (334 lb), last menstrual period 11/10/2010.  Physical Exam  Constitutional: She is oriented to person, place, and time. She appears well-developed and well-nourished.  HENT:  Head: Normocephalic.  Neck: Normal range of motion. Neck supple.  Cardiovascular: Normal rate, regular rhythm and normal heart sounds.   Respiratory: Effort normal and breath sounds normal.  GI: Soft. There is no tenderness.  Genitourinary: No bleeding around the vagina. Vaginal discharge (mucusy) found.       Cervix - closed  Neurological: She is alert and oriented to person, place, and time.  Skin: Skin is warm and dry.    MAU Course  Procedures   Assessment and Plan  Round Ligament Pain  Plan: DC to home Recommended a  belly band for pain.  [facility] 05/06/2011, 7:29 PM

## 2011-05-06 NOTE — Progress Notes (Signed)
Threasa Heads CNM in to see pt. EFM reviewed by CNM. OK to d/c EFM per CNM

## 2011-05-06 NOTE — Progress Notes (Signed)
FHR 150's, 10x10 Toco - none

## 2011-05-06 NOTE — Progress Notes (Signed)
Abdominal pain X 2 days. Feels like pulling, worse with movement, walking, rolling over in bed.

## 2011-05-06 NOTE — Consult Note (Cosign Needed)
Consulted with Dr. Renaldo Fiddler; reviewed HPI/exam>ok to discharge home.

## 2011-05-06 NOTE — Progress Notes (Signed)
Written and verbal d/c instructions given  And understanding vocied. Threasa Heads CNM had discussed round ligament pain with pt when she was in and did sve.

## 2011-06-07 LAB — URINALYSIS, ROUTINE W REFLEX MICROSCOPIC
Bilirubin Urine: NEGATIVE
Ketones, ur: NEGATIVE
Nitrite: NEGATIVE
Protein, ur: NEGATIVE
Urobilinogen, UA: 1

## 2011-06-07 LAB — POCT RAPID STREP A: Streptococcus, Group A Screen (Direct): NEGATIVE

## 2011-06-17 ENCOUNTER — Inpatient Hospital Stay (HOSPITAL_COMMUNITY)
Admission: AD | Admit: 2011-06-17 | Discharge: 2011-06-17 | Disposition: A | Discharge status: Home / Self Care | Payer: BC Managed Care – PPO | Source: Ambulatory Visit | Attending: Obstetrics and Gynecology | Admitting: Obstetrics and Gynecology

## 2011-06-17 ENCOUNTER — Encounter (HOSPITAL_COMMUNITY): Payer: Self-pay

## 2011-06-17 DIAGNOSIS — O4700 False labor before 37 completed weeks of gestation, unspecified trimester: Secondary | ICD-10-CM

## 2011-06-17 DIAGNOSIS — R109 Unspecified abdominal pain: Secondary | ICD-10-CM | POA: Insufficient documentation

## 2011-06-17 LAB — URINE MICROSCOPIC-ADD ON

## 2011-06-17 LAB — URINALYSIS, ROUTINE W REFLEX MICROSCOPIC
Bilirubin Urine: NEGATIVE
Glucose, UA: NEGATIVE mg/dL
Ketones, ur: 15 mg/dL — AB
Leukocytes, UA: NEGATIVE
Protein, ur: NEGATIVE mg/dL
pH: 6 (ref 5.0–8.0)

## 2011-06-17 MED ORDER — TERBUTALINE SULFATE 1 MG/ML IJ SOLN
0.2500 mg | Freq: Once | INTRAMUSCULAR | Status: AC
  Administered 2011-06-17: 0.25 mg via SUBCUTANEOUS
  Filled 2011-06-17: qty 1

## 2011-06-17 MED ORDER — NIFEDIPINE 10 MG PO CAPS: 10.0000 mg | ORAL_CAPSULE | Freq: Three times a day (TID) | ORAL | Status: DC | PRN

## 2011-06-17 NOTE — ED Provider Notes (Signed)
History     Chief Complaint  Patient presents with  . Abdominal Pain   HPI G1 P0 at 31.5 weeks presents with c/o abdominal tightening and cramps since yesterday. No leaking or bleeding. States was at office today but "forgot" to tell them about the contractions because her nasal stuffiness was her primary concern.    Past Medical History  Diagnosis Date  . Abnormal Pap smear     Past Surgical History  Procedure Date  . Breast reduction surgery   . Salpingectomy 2004    Scar tissue???    History reviewed. No pertinent family history.  History  Substance Use Topics  . Smoking status: Never Smoker   . Smokeless tobacco: Never Used  . Alcohol Use: No    Allergies:  Allergies  Allergen Reactions  . Vioxx (Rofecoxib) Rash    Patient reported it was years ago.  . Clindamycin/Lincomycin Swelling    Facial swelling    Prescriptions prior to admission  Medication Sig Dispense Refill  . loratadine (CLARITIN) 10 MG tablet Take 10 mg by mouth daily.        . prenatal vitamin w/FE, FA (PRENATAL 1 + 1) 27-1 MG TABS Take 1 tablet by mouth at bedtime.         ROS As above.  Physical Exam   Blood pressure 144/83, pulse 101, temperature 99.3 F (37.4 C), temperature source Oral, resp. rate 20, height 5' 8.25" (1.734 m), weight 338 lb (153.316 kg), last menstrual period 11/10/2010, SpO2 98.00%.  Physical Exam  Constitutional: She is oriented to person, place, and time. She appears well-developed and well-nourished. No distress.  HENT:  Head: Normocephalic.  Mouth/Throat: Oropharyngeal exudate present.  Respiratory: Effort normal.  GI: Soft. She exhibits no distension and no mass. There is no tenderness. There is no rebound and no guarding.       Gravid, EFM shows reactive FHR with irregular contractions every 3-5 minutes  Genitourinary: Vagina normal and uterus normal.       FFn done. Cervix closed/75-9=80%/-2/vtx?Marland Kitchen  Musculoskeletal: Normal range of motion.    Neurological: She is alert and oriented to person, place, and time.  Skin: Skin is warm and dry.  Psychiatric: She has a normal mood and affect.    MAU Course  Procedures  Assessment and Plan  A:  Preterm labor with premature effacement P:  Consulted Dr Rana Snare Will give Terbutaline for tocolysis FFN sent  [facility] 06/17/2011, 8:54 PM   FFN Negative,  UCs lessened with Terbutaline Will d/c home with Nifedipine Recommend rest over weekend, but may work if UCs resolve Followup Monday in office

## 2011-06-17 NOTE — Progress Notes (Addendum)
Patient is here with c/o abodminal tightening and cramping since Thursday. She is taking z-pack for a head cold. She reports good fetal movement, denies any vaginal bleeding, lof or discharge.

## 2011-06-17 NOTE — Progress Notes (Signed)
Pt states, " My whole stomach keeps getting tight, and I have more discomfort in my pelvic area."

## 2011-06-18 ENCOUNTER — Encounter (HOSPITAL_COMMUNITY): Payer: Self-pay | Admitting: *Deleted

## 2011-06-18 ENCOUNTER — Inpatient Hospital Stay (HOSPITAL_COMMUNITY)
Admission: AD | Admit: 2011-06-18 | Discharge: 2011-06-18 | Discharge status: Against Medical Advice | Payer: BC Managed Care – PPO | Source: Ambulatory Visit | Attending: Obstetrics and Gynecology | Admitting: Obstetrics and Gynecology

## 2011-06-18 DIAGNOSIS — O4700 False labor before 37 completed weeks of gestation, unspecified trimester: Secondary | ICD-10-CM | POA: Diagnosis present

## 2011-06-18 NOTE — ED Provider Notes (Signed)
History     Chief Complaint  Patient presents with  . Contractions   HPI G1P0000 at [redacted]w[redacted]d here c/o contractions. Was seen in MAU yesterday with same c/o, contracting at that time, cervix was felt to be effaced, but closed. Had a dose of terbutaline, negative FFN, sent home with procardia. States she has felt irregular UCs today. Took 10 mg procardia at 10:30 AM. No bleeding or LOF. Has only had one glass of tea to drink all day.   OB History    Grav Para Term Preterm Abortions TAB SAB Ect Mult Living   1 0 0 0 0 0 0 0 0 0       Past Medical History  Diagnosis Date  . Abnormal Pap smear     Past Surgical History  Procedure Date  . Breast reduction surgery   . Salpingectomy 2004    Scar tissue???    No family history on file.  History  Substance Use Topics  . Smoking status: Never Smoker   . Smokeless tobacco: Never Used  . Alcohol Use: No    Allergies:  Allergies  Allergen Reactions  . Vioxx (Rofecoxib) Hives and Rash    Patient reported it was years ago.  . Clindamycin/Lincomycin Swelling    Facial swelling    Prescriptions prior to admission  Medication Sig Dispense Refill  . NIFEdipine (PROCARDIA) 10 MG capsule Take 10 mg by mouth 3 (three) times daily.        . prenatal vitamin w/FE, FA (PRENATAL 1 + 1) 27-1 MG TABS Take 1 tablet by mouth at bedtime.       Marland Kitchen loratadine (CLARITIN) 10 MG tablet Take 10 mg by mouth daily.          Review of Systems  Constitutional: Negative.   Respiratory: Negative.   Cardiovascular: Negative.   Gastrointestinal: Negative for nausea, vomiting, abdominal pain, diarrhea and constipation.  Genitourinary: Negative for dysuria, urgency, frequency, hematuria and flank pain.       Negative for vaginal bleeding, Positive for cramping/contractions  Musculoskeletal: Negative.   Neurological: Negative.   Psychiatric/Behavioral: Negative.    Physical Exam   Blood pressure 109/73, pulse 103, temperature 99.3 F (37.4 C),  temperature source Oral, resp. rate 18, height 5\' 7"  (1.702 m), weight 153.225 kg (337 lb 12.8 oz), last menstrual period 11/10/2010.  Physical Exam  Nursing note and vitals reviewed. Constitutional: She is oriented to person, place, and time. She appears well-developed and well-nourished. No distress.  HENT:  Head: Normocephalic and atraumatic.  Cardiovascular: Normal rate.   Respiratory: Effort normal.  GI: Soft. Bowel sounds are normal. She exhibits no mass. There is no tenderness. There is no rebound and no guarding.  Genitourinary: There is no rash or lesion on the right labia. There is no rash or lesion on the left labia. Uterus is tender. Enlarged: Size c/w dates. Cervix exhibits discharge. No tenderness or bleeding around the vagina. No vaginal discharge found.       SVE: closed/50/firm/high  Musculoskeletal: Normal range of motion.  Neurological: She is alert and oriented to person, place, and time.  Skin: Skin is warm and dry.  Psychiatric: She has a normal mood and affect.   EFM: reactive, toco: mild irritability  MAU Course  Procedures    Assessment and Plan  35 y.o. G1P0000 at [redacted]w[redacted]d Threatened preterm labor - stable status D/C home with precautions, continue procardia as prescribed Dr. Rana Snare agrees with plan [facility] 06/18/2011, 3:34 PM

## 2011-06-18 NOTE — ED Notes (Signed)
Georges Mouse CNM at bedside

## 2011-06-18 NOTE — ED Notes (Signed)
PO hydration started

## 2011-06-18 NOTE — Progress Notes (Signed)
Was seen in MAU during the night given Terbutaline and given Procardia took a dose at 10:30 still having contractions.

## 2011-07-16 ENCOUNTER — Inpatient Hospital Stay (HOSPITAL_COMMUNITY)
Admission: AD | Admit: 2011-07-16 | Discharge: 2011-07-16 | Disposition: A | Discharge status: Home / Self Care | Payer: BC Managed Care – PPO | Source: Ambulatory Visit | Attending: Obstetrics and Gynecology | Admitting: Obstetrics and Gynecology

## 2011-07-16 ENCOUNTER — Encounter (HOSPITAL_COMMUNITY): Payer: Self-pay | Admitting: *Deleted

## 2011-07-16 DIAGNOSIS — O4700 False labor before 37 completed weeks of gestation, unspecified trimester: Secondary | ICD-10-CM | POA: Insufficient documentation

## 2011-07-16 LAB — URINE MICROSCOPIC-ADD ON

## 2011-07-16 LAB — URINALYSIS, ROUTINE W REFLEX MICROSCOPIC
Leukocytes, UA: NEGATIVE
Nitrite: NEGATIVE
Specific Gravity, Urine: 1.02 (ref 1.005–1.030)
Urobilinogen, UA: 0.2 mg/dL (ref 0.0–1.0)

## 2011-07-16 NOTE — Progress Notes (Signed)
G1 at 36.6wks. Contractions all day. At 1700 had pain in abd like baby balling up-"guess it was contractions". Just didn't feel well. THen vomited. Denies diarrhea.

## 2011-08-11 ENCOUNTER — Encounter (HOSPITAL_COMMUNITY): Payer: Self-pay | Admitting: *Deleted

## 2011-08-11 ENCOUNTER — Telehealth (HOSPITAL_COMMUNITY): Payer: Self-pay | Admitting: *Deleted

## 2011-08-11 NOTE — Telephone Encounter (Signed)
Preadmission screen  

## 2011-08-15 ENCOUNTER — Inpatient Hospital Stay (HOSPITAL_COMMUNITY)
Admission: RE | Admit: 2011-08-15 | Discharge: 2011-08-19 | DRG: 371 | Disposition: A | Discharge status: Home / Self Care | Payer: BC Managed Care – PPO | Source: Ambulatory Visit | Attending: Obstetrics and Gynecology | Admitting: Obstetrics and Gynecology

## 2011-08-15 ENCOUNTER — Encounter (HOSPITAL_COMMUNITY): Payer: Self-pay

## 2011-08-15 DIAGNOSIS — O324XX0 Maternal care for high head at term, not applicable or unspecified: Secondary | ICD-10-CM | POA: Diagnosis present

## 2011-08-15 DIAGNOSIS — O480 Post-term pregnancy: Principal | ICD-10-CM | POA: Diagnosis present

## 2011-08-15 DIAGNOSIS — O4700 False labor before 37 completed weeks of gestation, unspecified trimester: Secondary | ICD-10-CM

## 2011-08-15 LAB — CBC
HCT: 35.6 % — ABNORMAL LOW (ref 36.0–46.0)
Hemoglobin: 12 g/dL (ref 12.0–15.0)
MCV: 88.3 fL (ref 78.0–100.0)
RBC: 4.03 MIL/uL (ref 3.87–5.11)
WBC: 11.7 10*3/uL — ABNORMAL HIGH (ref 4.0–10.5)

## 2011-08-15 MED ORDER — ACETAMINOPHEN 325 MG PO TABS: 650.0000 mg | ORAL_TABLET | ORAL | Status: DC | PRN

## 2011-08-15 MED ORDER — OXYTOCIN BOLUS FROM INFUSION
500.0000 mL | Freq: Once | INTRAVENOUS | Status: DC
  Filled 2011-08-15: qty 500

## 2011-08-15 MED ORDER — TERBUTALINE SULFATE 1 MG/ML IJ SOLN: 0.2500 mg | Freq: Once | INTRAMUSCULAR | Status: AC | PRN

## 2011-08-15 MED ORDER — IBUPROFEN 600 MG PO TABS: 600.0000 mg | ORAL_TABLET | Freq: Four times a day (QID) | ORAL | Status: DC | PRN

## 2011-08-15 MED ORDER — FLEET ENEMA 7-19 GM/118ML RE ENEM: 1.0000 | ENEMA | RECTAL | Status: DC | PRN

## 2011-08-15 MED ORDER — LACTATED RINGERS IV SOLN
INTRAVENOUS | Status: DC
  Administered 2011-08-16 (×2): via INTRAVENOUS

## 2011-08-15 MED ORDER — ZOLPIDEM TARTRATE 10 MG PO TABS
10.0000 mg | ORAL_TABLET | Freq: Every evening | ORAL | Status: DC | PRN
  Administered 2011-08-16: 10 mg via ORAL
  Filled 2011-08-15: qty 1

## 2011-08-15 MED ORDER — BUTORPHANOL TARTRATE 2 MG/ML IJ SOLN
1.0000 mg | INTRAMUSCULAR | Status: DC | PRN
  Administered 2011-08-16 (×2): 1 mg via INTRAVENOUS
  Filled 2011-08-15 (×2): qty 1

## 2011-08-15 MED ORDER — OXYCODONE-ACETAMINOPHEN 5-325 MG PO TABS: 2.0000 | ORAL_TABLET | ORAL | Status: DC | PRN

## 2011-08-15 MED ORDER — CITRIC ACID-SODIUM CITRATE 334-500 MG/5ML PO SOLN
30.0000 mL | ORAL | Status: DC | PRN
  Administered 2011-08-16: 30 mL via ORAL

## 2011-08-15 MED ORDER — MISOPROSTOL 25 MCG QUARTER TABLET
25.0000 ug | ORAL_TABLET | ORAL | Status: DC | PRN
  Administered 2011-08-15 – 2011-08-16 (×2): 25 ug via VAGINAL
  Filled 2011-08-15 (×2): qty 0.25

## 2011-08-15 MED ORDER — OXYTOCIN 20 UNITS IN LACTATED RINGERS INFUSION - SIMPLE: 125.0000 mL/h | Freq: Once | INTRAVENOUS | Status: DC

## 2011-08-15 MED ORDER — LIDOCAINE HCL (PF) 1 % IJ SOLN
30.0000 mL | INTRAMUSCULAR | Status: DC | PRN
Start: 2011-08-15 — End: 2011-08-16
  Filled 2011-08-15: qty 30

## 2011-08-15 MED ORDER — PRENATAL PLUS 27-1 MG PO TABS: 1.0000 | ORAL_TABLET | Freq: Every day | ORAL | Status: DC

## 2011-08-15 MED ORDER — OXYTOCIN 20 UNITS IN LACTATED RINGERS INFUSION - SIMPLE
1.0000 m[IU]/min | INTRAVENOUS | Status: DC
  Administered 2011-08-16: 2 m[IU]/min via INTRAVENOUS
  Filled 2011-08-15: qty 1000

## 2011-08-15 MED ORDER — ONDANSETRON HCL 4 MG/2ML IJ SOLN: 4.0000 mg | Freq: Four times a day (QID) | INTRAMUSCULAR | Status: DC | PRN

## 2011-08-15 MED ORDER — LACTATED RINGERS IV SOLN: 500.0000 mL | INTRAVENOUS | Status: DC | PRN

## 2011-08-15 NOTE — H&P (Signed)
Mary Ray is a 35 y.o. female presenting for induction of labor due to postdates Pregancy uncomplicated.  GBS -. History OB History    Grav Para Term Preterm Abortions TAB SAB Ect Mult Living   1 0 0 0 0 0 0 0 0 0      No past medical history on file. Past Surgical History  Procedure Date  . Breast reduction surgery   . Salpingectomy 2004    Scar tissue???   Family History: family history includes Diabetes in her father and mother; Hypertension in her father, mother, and sister; Learning disabilities in her cousin; and Stroke in her father.  There is no history of Anesthesia problems, and Hypotension, and Malignant hyperthermia, and Pseudochol deficiency, . Social History:  reports that she has never smoked. She has never used smokeless tobacco. She reports that she does not drink alcohol or use illicit drugs.  ROS    Blood pressure 134/75, pulse 88, temperature 98.6 F (37 C), temperature source Oral, resp. rate 20, height 5\' 7"  (1.702 m), weight 157.398 kg (347 lb), last menstrual period 11/10/2010. Exam Physical Exam Cx Cl/th/high per RN No HSV Lesion seen Prenatal labs: ABO, Rh: A/Positive/-- (06/12 0000) Antibody: Negative (06/12 0000) Rubella: Immune (06/12 0000) RPR: Nonreactive (06/12 0000)  HBsAg: Negative (06/12 0000)  HIV: Non-reactive (06/12 0000)  GBS: Negative (11/23 0000)   Assessment/Plan: IUP at 40 + weeks for IOL per pt request No recent HSV lesions or sxs Plan cytotec/Pitocin   Marria Mathison C 08/15/2011, 9:32 PM

## 2011-08-16 ENCOUNTER — Encounter (HOSPITAL_COMMUNITY): Payer: Self-pay | Admitting: Anesthesiology

## 2011-08-16 ENCOUNTER — Encounter (HOSPITAL_COMMUNITY): Payer: Self-pay

## 2011-08-16 ENCOUNTER — Encounter (HOSPITAL_COMMUNITY)
Admission: RE | Disposition: A | Discharge status: Home / Self Care | Payer: Self-pay | Source: Ambulatory Visit | Attending: Obstetrics and Gynecology

## 2011-08-16 ENCOUNTER — Inpatient Hospital Stay (HOSPITAL_COMMUNITY): Payer: BC Managed Care – PPO | Admitting: Anesthesiology

## 2011-08-16 SURGERY — Surgical Case
Anesthesia: Epidural | Site: Abdomen | Wound class: Clean Contaminated

## 2011-08-16 MED ORDER — SODIUM CHLORIDE 0.9 % IJ SOLN: 3.0000 mL | Freq: Two times a day (BID) | INTRAMUSCULAR | Status: DC

## 2011-08-16 MED ORDER — ZOLPIDEM TARTRATE 5 MG PO TABS: 5.0000 mg | ORAL_TABLET | Freq: Every evening | ORAL | Status: DC | PRN

## 2011-08-16 MED ORDER — FENTANYL 2.5 MCG/ML BUPIVACAINE 1/10 % EPIDURAL INFUSION (WH - ANES)
14.0000 mL/h | INTRAMUSCULAR | Status: DC
  Administered 2011-08-16 (×4): 14 mL/h via EPIDURAL
  Filled 2011-08-16 (×4): qty 60

## 2011-08-16 MED ORDER — PROMETHAZINE HCL 25 MG/ML IJ SOLN: 6.2500 mg | INTRAMUSCULAR | Status: DC | PRN

## 2011-08-16 MED ORDER — TETANUS-DIPHTH-ACELL PERTUSSIS 5-2.5-18.5 LF-MCG/0.5 IM SUSP: 0.5000 mL | Freq: Once | INTRAMUSCULAR | Status: DC

## 2011-08-16 MED ORDER — SIMETHICONE 80 MG PO CHEW: 80.0000 mg | CHEWABLE_TABLET | ORAL | Status: DC | PRN

## 2011-08-16 MED ORDER — ONDANSETRON HCL 4 MG/2ML IJ SOLN
INTRAMUSCULAR | Status: AC
  Filled 2011-08-16: qty 2

## 2011-08-16 MED ORDER — HYDROMORPHONE HCL PF 1 MG/ML IJ SOLN: 0.2500 mg | INTRAMUSCULAR | Status: DC | PRN

## 2011-08-16 MED ORDER — SODIUM BICARBONATE 8.4 % IV SOLN
INTRAVENOUS | Status: AC
  Filled 2011-08-16: qty 50

## 2011-08-16 MED ORDER — EPHEDRINE 5 MG/ML INJ
10.0000 mg | INTRAVENOUS | Status: DC | PRN
  Filled 2011-08-16: qty 4

## 2011-08-16 MED ORDER — MORPHINE SULFATE (PF) 0.5 MG/ML IJ SOLN
INTRAMUSCULAR | Status: DC | PRN
  Administered 2011-08-16: 2 mg via INTRAVENOUS

## 2011-08-16 MED ORDER — LIDOCAINE-EPINEPHRINE (PF) 2 %-1:200000 IJ SOLN
INTRAMUSCULAR | Status: AC
  Filled 2011-08-16: qty 20

## 2011-08-16 MED ORDER — MEPERIDINE HCL 25 MG/ML IJ SOLN: 6.2500 mg | INTRAMUSCULAR | Status: DC | PRN

## 2011-08-16 MED ORDER — SODIUM BICARBONATE 8.4 % IV SOLN
INTRAVENOUS | Status: DC | PRN
  Administered 2011-08-16: 5 mL via EPIDURAL

## 2011-08-16 MED ORDER — SODIUM CHLORIDE 0.9 % IJ SOLN: 3.0000 mL | INTRAMUSCULAR | Status: DC | PRN

## 2011-08-16 MED ORDER — SODIUM CHLORIDE 0.9 % IV SOLN: 250.0000 mL | INTRAVENOUS | Status: DC | PRN

## 2011-08-16 MED ORDER — OXYTOCIN 20 UNITS IN LACTATED RINGERS INFUSION - SIMPLE
125.0000 mL/h | INTRAVENOUS | Status: AC
  Administered 2011-08-16: 125 mL/h via INTRAVENOUS
  Filled 2011-08-16: qty 1000

## 2011-08-16 MED ORDER — CEFAZOLIN SODIUM 1-5 GM-% IV SOLN
INTRAVENOUS | Status: DC | PRN
  Administered 2011-08-16: 1 g via INTRAVENOUS

## 2011-08-16 MED ORDER — SCOPOLAMINE 1 MG/3DAYS TD PT72
1.0000 | MEDICATED_PATCH | Freq: Once | TRANSDERMAL | Status: DC
  Administered 2011-08-16: 1.5 mg via TRANSDERMAL

## 2011-08-16 MED ORDER — CEFAZOLIN SODIUM 1-5 GM-% IV SOLN
INTRAVENOUS | Status: AC
  Filled 2011-08-16: qty 50

## 2011-08-16 MED ORDER — PHENYLEPHRINE 40 MCG/ML (10ML) SYRINGE FOR IV PUSH (FOR BLOOD PRESSURE SUPPORT)
80.0000 ug | PREFILLED_SYRINGE | INTRAVENOUS | Status: DC | PRN
  Filled 2011-08-16: qty 5

## 2011-08-16 MED ORDER — DIBUCAINE 1 % RE OINT: 1.0000 "application " | TOPICAL_OINTMENT | RECTAL | Status: DC | PRN

## 2011-08-16 MED ORDER — FLEET ENEMA 7-19 GM/118ML RE ENEM: 1.0000 | ENEMA | Freq: Every day | RECTAL | Status: DC | PRN

## 2011-08-16 MED ORDER — SENNOSIDES-DOCUSATE SODIUM 8.6-50 MG PO TABS
2.0000 | ORAL_TABLET | Freq: Every day | ORAL | Status: DC
  Administered 2011-08-17 – 2011-08-18 (×2): 2 via ORAL

## 2011-08-16 MED ORDER — MENTHOL 3 MG MT LOZG: 1.0000 | LOZENGE | OROMUCOSAL | Status: DC | PRN

## 2011-08-16 MED ORDER — DIPHENHYDRAMINE HCL 25 MG PO CAPS: 25.0000 mg | ORAL_CAPSULE | Freq: Four times a day (QID) | ORAL | Status: DC | PRN

## 2011-08-16 MED ORDER — ONDANSETRON HCL 4 MG/2ML IJ SOLN: 4.0000 mg | INTRAMUSCULAR | Status: DC | PRN

## 2011-08-16 MED ORDER — OXYTOCIN 20 UNITS IN LACTATED RINGERS INFUSION - SIMPLE
INTRAVENOUS | Status: DC | PRN
  Administered 2011-08-16: 20 [IU] via INTRAVENOUS

## 2011-08-16 MED ORDER — PRENATAL MULTIVITAMIN CH
1.0000 | ORAL_TABLET | Freq: Every day | ORAL | Status: DC
  Administered 2011-08-17 – 2011-08-19 (×3): 1 via ORAL
  Filled 2011-08-16 (×3): qty 1

## 2011-08-16 MED ORDER — LACTATED RINGERS IV SOLN
INTRAVENOUS | Status: DC
  Administered 2011-08-17: 11:00:00 via INTRAVENOUS

## 2011-08-16 MED ORDER — LACTATED RINGERS IV SOLN: 500.0000 mL | Freq: Once | INTRAVENOUS | Status: DC

## 2011-08-16 MED ORDER — SIMETHICONE 80 MG PO CHEW
80.0000 mg | CHEWABLE_TABLET | Freq: Three times a day (TID) | ORAL | Status: DC
  Administered 2011-08-17 – 2011-08-19 (×9): 80 mg via ORAL

## 2011-08-16 MED ORDER — SCOPOLAMINE 1 MG/3DAYS TD PT72
MEDICATED_PATCH | TRANSDERMAL | Status: AC
  Administered 2011-08-16: 1.5 mg via TRANSDERMAL
  Filled 2011-08-16: qty 1

## 2011-08-16 MED ORDER — ONDANSETRON HCL 4 MG/2ML IJ SOLN
INTRAMUSCULAR | Status: DC | PRN
  Administered 2011-08-16: 4 mg via INTRAVENOUS

## 2011-08-16 MED ORDER — MORPHINE SULFATE 0.5 MG/ML IJ SOLN
INTRAMUSCULAR | Status: AC
  Filled 2011-08-16: qty 10

## 2011-08-16 MED ORDER — EPHEDRINE 5 MG/ML INJ: 10.0000 mg | INTRAVENOUS | Status: DC | PRN

## 2011-08-16 MED ORDER — IBUPROFEN 600 MG PO TABS
600.0000 mg | ORAL_TABLET | Freq: Four times a day (QID) | ORAL | Status: DC
  Administered 2011-08-16 – 2011-08-19 (×11): 600 mg via ORAL
  Filled 2011-08-16 (×11): qty 1

## 2011-08-16 MED ORDER — MORPHINE SULFATE (PF) 0.5 MG/ML IJ SOLN
INTRAMUSCULAR | Status: DC | PRN
  Administered 2011-08-16: 3 mg via EPIDURAL

## 2011-08-16 MED ORDER — WITCH HAZEL-GLYCERIN EX PADS: 1.0000 "application " | MEDICATED_PAD | CUTANEOUS | Status: DC | PRN

## 2011-08-16 MED ORDER — ONDANSETRON HCL 4 MG PO TABS: 4.0000 mg | ORAL_TABLET | ORAL | Status: DC | PRN

## 2011-08-16 MED ORDER — LIDOCAINE HCL 1.5 % IJ SOLN
INTRAMUSCULAR | Status: DC | PRN
  Administered 2011-08-16 (×2): 5 mL via INTRADERMAL
  Administered 2011-08-16: 2 mL via INTRADERMAL

## 2011-08-16 MED ORDER — OXYCODONE-ACETAMINOPHEN 5-325 MG PO TABS
1.0000 | ORAL_TABLET | ORAL | Status: DC | PRN
  Administered 2011-08-16 – 2011-08-17 (×3): 2 via ORAL
  Administered 2011-08-17 (×2): 1 via ORAL
  Administered 2011-08-17 – 2011-08-18 (×4): 2 via ORAL
  Administered 2011-08-18: 1 via ORAL
  Administered 2011-08-18: 2 via ORAL
  Administered 2011-08-18: 1 via ORAL
  Administered 2011-08-19: 2 via ORAL
  Administered 2011-08-19: 1 via ORAL
  Filled 2011-08-16 (×2): qty 2
  Filled 2011-08-16 (×2): qty 1
  Filled 2011-08-16 (×3): qty 2
  Filled 2011-08-16: qty 1
  Filled 2011-08-16 (×6): qty 2

## 2011-08-16 MED ORDER — DIPHENHYDRAMINE HCL 50 MG/ML IJ SOLN: 12.5000 mg | INTRAMUSCULAR | Status: DC | PRN

## 2011-08-16 MED ORDER — BISACODYL 10 MG RE SUPP
10.0000 mg | Freq: Every day | RECTAL | Status: DC | PRN
  Administered 2011-08-18: 10 mg via RECTAL
  Filled 2011-08-16: qty 1

## 2011-08-16 MED ORDER — PHENYLEPHRINE 40 MCG/ML (10ML) SYRINGE FOR IV PUSH (FOR BLOOD PRESSURE SUPPORT): 80.0000 ug | PREFILLED_SYRINGE | INTRAVENOUS | Status: DC | PRN

## 2011-08-16 MED ORDER — LANOLIN HYDROUS EX OINT: 1.0000 "application " | TOPICAL_OINTMENT | CUTANEOUS | Status: DC | PRN

## 2011-08-16 MED ORDER — LACTATED RINGERS IV SOLN
INTRAVENOUS | Status: DC | PRN
  Administered 2011-08-16 (×2): via INTRAVENOUS

## 2011-08-16 MED ORDER — OXYTOCIN 10 UNIT/ML IJ SOLN
INTRAMUSCULAR | Status: AC
  Filled 2011-08-16: qty 4

## 2011-08-16 MED ORDER — CITRIC ACID-SODIUM CITRATE 334-500 MG/5ML PO SOLN
ORAL | Status: AC
  Administered 2011-08-16: 30 mL via ORAL
  Filled 2011-08-16: qty 15

## 2011-08-16 SURGICAL SUPPLY — 30 items
CLOTH BEACON ORANGE TIMEOUT ST (SAFETY) ×2 IMPLANT
DRESSING TELFA 8X3 (GAUZE/BANDAGES/DRESSINGS) ×2 IMPLANT
DRSG PAD ABDOMINAL 8X10 ST (GAUZE/BANDAGES/DRESSINGS) ×2 IMPLANT
ELECT REM PT RETURN 9FT ADLT (ELECTROSURGICAL) ×2
ELECTRODE REM PT RTRN 9FT ADLT (ELECTROSURGICAL) ×1 IMPLANT
EXTRACTOR VACUUM M CUP 4 TUBE (SUCTIONS) IMPLANT
GAUZE SPONGE 4X4 12PLY STRL LF (GAUZE/BANDAGES/DRESSINGS) ×4 IMPLANT
GLOVE BIO SURGEON STRL SZ7 (GLOVE) ×4 IMPLANT
GOWN PREVENTION PLUS LG XLONG (DISPOSABLE) ×4 IMPLANT
KIT ABG SYR 3ML LUER SLIP (SYRINGE) ×2 IMPLANT
NEEDLE HYPO 25X5/8 SAFETYGLIDE (NEEDLE) ×2 IMPLANT
NS IRRIG 1000ML POUR BTL (IV SOLUTION) ×2 IMPLANT
PACK C SECTION WH (CUSTOM PROCEDURE TRAY) ×2 IMPLANT
PAD ABD 7.5X8 STRL (GAUZE/BANDAGES/DRESSINGS) ×2 IMPLANT
SLEEVE SCD COMPRESS KNEE MED (MISCELLANEOUS) ×2 IMPLANT
SPONGE GAUZE 4X4 12PLY (GAUZE/BANDAGES/DRESSINGS) ×2 IMPLANT
STAPLER VISISTAT 35W (STAPLE) ×2 IMPLANT
STRIP CLOSURE SKIN 1/4X4 (GAUZE/BANDAGES/DRESSINGS) ×2 IMPLANT
SUT CHROMIC 0 CT 1 (SUTURE) IMPLANT
SUT CHROMIC 0 CTX 36 (SUTURE) ×6 IMPLANT
SUT CHROMIC 2 0 SH (SUTURE) IMPLANT
SUT PDS AB 0 CT 36 (SUTURE) ×2 IMPLANT
SUT PLAIN 0 NONE (SUTURE) IMPLANT
SUT PLAIN 2 0 XLH (SUTURE) ×4 IMPLANT
SUT VIC AB 3-0 CT1 27 (SUTURE) ×2
SUT VIC AB 3-0 CT1 TAPERPNT 27 (SUTURE) ×1 IMPLANT
TAPE CLOTH SURG 4X10 WHT LF (GAUZE/BANDAGES/DRESSINGS) ×2 IMPLANT
TOWEL OR 17X24 6PK STRL BLUE (TOWEL DISPOSABLE) ×4 IMPLANT
TRAY FOLEY CATH 14FR (SET/KITS/TRAYS/PACK) IMPLANT
WATER STERILE IRR 1000ML POUR (IV SOLUTION) IMPLANT

## 2011-08-16 NOTE — Anesthesia Preprocedure Evaluation (Signed)
Anesthesia Evaluation  Patient identified by MRN, date of birth, ID band Patient awake    Reviewed: Allergy & Precautions, H&P , NPO status , Patient's Chart, lab work & pertinent test results, reviewed documented beta blocker date and time   History of Anesthesia Complications Negative for: history of anesthetic complications  Airway Mallampati: I TM Distance: >3 FB Neck ROM: full    Dental  (+) Teeth Intact   Pulmonary neg pulmonary ROS,  clear to auscultation        Cardiovascular hypertension (on this admission, labs were neg for PIH), regular Normal    Neuro/Psych Negative Neurological ROS  Negative Psych ROS   GI/Hepatic negative GI ROS, Neg liver ROS,   Endo/Other  Morbid obesity  Renal/GU negative Renal ROS     Musculoskeletal   Abdominal   Peds  Hematology negative hematology ROS (+)   Anesthesia Other Findings   Reproductive/Obstetrics (+) Pregnancy                           Anesthesia Physical Anesthesia Plan  ASA: III  Anesthesia Plan: Epidural   Post-op Pain Management:    Induction:   Airway Management Planned:   Additional Equipment:   Intra-op Plan:   Post-operative Plan:   Informed Consent: I have reviewed the patients History and Physical, chart, labs and discussed the procedure including the risks, benefits and alternatives for the proposed anesthesia with the patient or authorized representative who has indicated his/her understanding and acceptance.     Plan Discussed with:   Anesthesia Plan Comments:         Anesthesia Quick Evaluation

## 2011-08-16 NOTE — Progress Notes (Signed)
Patient ID: Mary Ray, female   DOB: October 29, 1975, 35 y.o.   MRN: [id number] Patient name DICTATION#[id number] CSN# [id number]  Juluis Mire, MD 08/16/2011 8:05 PM

## 2011-08-16 NOTE — Transfer of Care (Signed)
Immediate Anesthesia Transfer of Care Note  Patient: Mary Ray  Procedure(s) Performed:  CESAREAN SECTION  Patient Location: PACU  Anesthesia Type: Epidural  Level of Consciousness: oriented and sedated  Airway & Oxygen Therapy: Patient Spontanous Breathing  Post-op Assessment: Report given to PACU RN and Post -op Vital signs reviewed and stable  Post vital signs: stable  Complications: No apparent anesthesia complications

## 2011-08-16 NOTE — Brief Op Note (Signed)
08/15/2011 - 08/16/2011  8:04 PM  PATIENT:  Mary Ray  35 y.o. female  PRE-OPERATIVE DIAGNOSIS:  Arrest of Descent  POST-OPERATIVE DIAGNOSIS:  Arrest of Descent  PROCEDURE:  Procedure(s): CESAREAN SECTION  SURGEON:  Surgeon(s): Gwenda Heiner S Verniece Encarnacion  PHYSICIAN ASSISTANT:   ASSISTANTS: none   ANESTHESIA:   epidural  EBL:  Total I/O In: 1000 [I.V.:1000] Out: 900 [Urine:100; Blood:800]  BLOOD ADMINISTERED:none  DRAINS: Urinary Catheter (Foley)   LOCAL MEDICATIONS USED:  NONE  SPECIMEN:  No Specimen  DISPOSITION OF SPECIMEN:  [name]  COUNTS:  YES  TOURNIQUET:  * No tourniquets in log *  DICTATION: .Other Dictation: Dictation Number [id number]  PLAN OF CARE: Admit to inpatient   PATIENT DISPOSITION:  PACU - hemodynamically stable.   Delay start of Pharmacological VTE agent (>24hrs) due to surgical blood loss or risk of bleeding:  {YES/NO/NOT APPLICABLE:20182

## 2011-08-16 NOTE — Anesthesia Procedure Notes (Signed)
Epidural Patient location during procedure: OB Start time: 08/16/2011 8:12 AM Reason for block: procedure for pain  Staffing Performed by: anesthesiologist   Preanesthetic Checklist Completed: patient identified, site marked, surgical consent, pre-op evaluation, timeout performed, IV checked, risks and benefits discussed and monitors and equipment checked  Epidural Patient position: sitting Prep: site prepped and draped and DuraPrep Patient monitoring: continuous pulse ox and blood pressure Approach: midline Injection technique: LOR air  Needle:  Needle type: Tuohy  Needle gauge: 17 G Needle length: 9 cm Needle insertion depth: 7 cm Catheter type: closed end flexible Catheter size: 19 Gauge Catheter at skin depth: 12 cm Test dose: negative  Assessment Events: blood not aspirated, injection not painful, no injection resistance, negative IV test and no paresthesia  Additional Notes Discussed risk of headache, infection, bleeding, nerve injury and failed or incomplete block.  Patient voices understanding and wishes to proceed.

## 2011-08-16 NOTE — Progress Notes (Signed)
Patient ID: Mary Ray, female   DOB: October 12, 1975, 35 y.o.   MRN: [id number] Pushing for over 2 hours vtx arrested at - 1  Patient refuses further pushing vtx to high for vaccuum and large efw procede with cesarean section The risk of cesarean section were discussed including.  The risk of infection.  The risk of  Hemorrhage that could lead to transfusion with risk of AIDs and Hepatitis.  Excessive bleeding could require hysterectomy.  Risk of injury to adjacent organs including bladder, ureters and bowel which could lead to further exploratory surgery. The risk of DVT and pulmonary embolus.  Patient expresses understanding of indications and risks.

## 2011-08-16 NOTE — Progress Notes (Signed)
Mary Ray is a 35 y.o. G1P0000 at [redacted]w[redacted]d by ultrasound admitted for postdates  Subjective:   Objective: BP 137/80  Pulse 92  Temp(Src) 98.4 F (36.9 C) (Oral)  Resp 20  Ht 5\' 7"  (1.702 m)  Wt 157.398 kg (347 lb)  BMI 54.35 kg/m2  LMP 11/10/2010      FHT:  Reactive no decels UC:   regular, every 3 minutes SVE:   Dilation: 1 Effacement (%): 70 Station: Ballotable Exam by:: C.Blackstock, RN   Labs: Lab Results  Component Value Date   WBC 11.7* 08/15/2011   HGB 12.0 08/15/2011   HCT 35.6* 08/15/2011   MCV 88.3 08/15/2011   PLT 318 08/15/2011    Assessment / Plan: Induction of labor due to postterm,  progressing well on pitocin  Labor: on pitocin Preeclampsia:  no signs or symptoms of toxicity Fetal Wellbeing:  Category I Pain Control:  Labor support without medications I/D:  n/a Anticipated MOD:  NSVD  [name] 08/16/2011, 7:49 AM

## 2011-08-16 NOTE — Progress Notes (Signed)
Patient ID: Mary Ray, female   DOB: 08-Nov-1975, 35 y.o.   MRN: [id number] On 6 mu of pitocin cx 8-9 cm fhr ok

## 2011-08-16 NOTE — Anesthesia Postprocedure Evaluation (Signed)
Anesthesia Post Note  Patient: Mary Ray  Procedure(s) Performed:  CESAREAN SECTION  Anesthesia type: Epidural  Patient location: PACU  Post pain: Pain level controlled  Post assessment: Post-op Vital signs reviewed  Last Vitals:  Filed Vitals:   08/16/11 2115  BP: 124/62  Pulse: 105  Temp: 38.2 C  Resp: 28    Post vital signs: Reviewed  Level of consciousness: awake  Complications: No apparent anesthesia complications

## 2011-08-17 ENCOUNTER — Encounter (HOSPITAL_COMMUNITY): Payer: Self-pay | Admitting: Obstetrics and Gynecology

## 2011-08-17 LAB — CBC
HCT: 31.4 % — ABNORMAL LOW (ref 36.0–46.0)
Hemoglobin: 10.5 g/dL — ABNORMAL LOW (ref 12.0–15.0)
MCH: 29.5 pg (ref 26.0–34.0)
MCHC: 33.4 g/dL (ref 30.0–36.0)
MCV: 88.2 fL (ref 78.0–100.0)
RDW: 13.6 % (ref 11.5–15.5)

## 2011-08-17 MED ORDER — NALBUPHINE HCL 10 MG/ML IJ SOLN
5.0000 mg | INTRAMUSCULAR | Status: DC | PRN
  Filled 2011-08-17: qty 1

## 2011-08-17 MED ORDER — DIPHENHYDRAMINE HCL 50 MG/ML IJ SOLN: 25.0000 mg | INTRAMUSCULAR | Status: DC | PRN

## 2011-08-17 MED ORDER — NALOXONE HCL 0.4 MG/ML IJ SOLN: 0.4000 mg | INTRAMUSCULAR | Status: DC | PRN

## 2011-08-17 MED ORDER — DIPHENHYDRAMINE HCL 50 MG/ML IJ SOLN: 12.5000 mg | INTRAMUSCULAR | Status: DC | PRN

## 2011-08-17 MED ORDER — SODIUM CHLORIDE 0.9 % IV SOLN
1.0000 ug/kg/h | INTRAVENOUS | Status: DC | PRN
  Filled 2011-08-17: qty 2.5

## 2011-08-17 MED ORDER — DIPHENHYDRAMINE HCL 25 MG PO CAPS: 25.0000 mg | ORAL_CAPSULE | ORAL | Status: DC | PRN

## 2011-08-17 MED ORDER — SODIUM CHLORIDE 0.9 % IV SOLN
3.0000 g | Freq: Four times a day (QID) | INTRAVENOUS | Status: DC
  Administered 2011-08-17 – 2011-08-18 (×6): 3 g via INTRAVENOUS
  Filled 2011-08-17 (×7): qty 3

## 2011-08-17 MED ORDER — ONDANSETRON HCL 4 MG/2ML IJ SOLN: 4.0000 mg | Freq: Three times a day (TID) | INTRAMUSCULAR | Status: DC | PRN

## 2011-08-17 MED ORDER — SODIUM CHLORIDE 0.9 % IJ SOLN: 3.0000 mL | INTRAMUSCULAR | Status: DC | PRN

## 2011-08-17 NOTE — Addendum Note (Signed)
Addendum  created 08/17/11 0815 by Fanny Dance   Modules edited:Notes Section

## 2011-08-17 NOTE — Op Note (Signed)
NAMEGRACIELYNN, BIRKEL              ACCOUNT NO.:  [id number]  MEDICAL RECORD NO.:  [id number]  LOCATION:  9120                          FACILITY:  [facility]  PHYSICIAN:  Juluis Mire, M.D.   DATE OF BIRTH:  [birth year]  DATE OF PROCEDURE:  08/16/2011 DATE OF DISCHARGE:                              OPERATIVE REPORT   PREOPERATIVE DIAGNOSIS:  Intrauterine pregnancy, 40+ weeks with failure to descend.  POSTOPERATIVE DIAGNOSIS:  Intrauterine pregnancy, 40+ weeks with failure to descend.  OPERATIVE PROCEDURE:  Low transverse cesarean section.  SURGEON:  Juluis Mire, MD  ANESTHESIA:  Epidural.  ESTIMATED BLOOD LOSS:  800 mL.  PACKS AND DRAINS:  None.  INTRAOPERATIVE BLOOD PLACED:  None.  COMPLICATIONS:  None.  INDICATION:  A 35 year old primigravida female at 40+ weeks presented for induction of labor.  She was on Cytotec, subsequently received Pitocin, progressed to complete dilatation at 2 hours of pushing with the fetal vertex remained at -1, she refused any further pushing at this point in time.  Vertex was too high up to assess. Therefore we decided to proceed with primary cesarean section.  The risks were discussed including the risk of infection, the risk of hemorrhage that could require transfusion with risk of AIDS or hepatitis, risk of injury to adjacent organs including bladder, bowel, ureters that could require further exploratory surgery, risk of deep venous thrombosis, and pulmonary embolus, excessive bleeding could require hysterectomy.  The patient expressed understanding of indications, risks, and again declined further attempts at second stage.  PROCEDURE IN DETAIL:  The patient was taken to OR and placed in supine position with left lateral tilt.  After satisfactory level of epidural anesthesia was obtained, the abdomen was prepped out with Betadine and draped as sterile field.  A low transverse skin incision was made with knife and carried through  subcutaneous tissue.  Anterior rectus fascia was entered sharp and incision fashioned laterally.  Fascia was taken off the muscle superiorly and inferiorly.  Rectus muscles were separated in midline.  Perineum was entered sharply.  Incision of perineum extended both superiorly and inferiorly.  Bladder flap developed.  A low transverse uterine incision was begun with knife extending laterally using manual traction.  The amniotic fluid was clear.  The infant was delivered with elevation of head and fundal pressure.  The infant was a viable female, weighing 8 pounds 1 ounces.  Apgars were 7/9.  The placenta was delivered manually.  Uterus exteriorized for closure. Uterus was closed in a running locking suture of 0 chromic using a 2- layer closure technique.  We had good hemostasis.  Urine output was blood tinged but clearing.  Tubes and ovaries were unremarkable.  Some uterine enlargement secondary to fibroids. Uterus was returned to abdominal cavity.  We irrigated the pelvis, had no active bleeding and clear urine output.  Muscles and peritoneum were closed with sutures of 3-0 Vicryl.  Fascia was closed with a running suture of 0 PDS.  Skin was closed with staples.  Sponge, needle, and instrument counts were reported as correct by circulating nurse x2.  Foley catheter was clearing at the time of closure.  The patient tolerated the procedure well, was  returned to recovery in good condition.     Juluis Mire, M.D.     JSM/MEDQ  D:  08/16/2011  T:  08/17/2011  Job:  [id number]

## 2011-08-17 NOTE — Anesthesia Postprocedure Evaluation (Signed)
  Anesthesia Post-op Note  Patient: Mary Ray  Procedure(s) Performed:  CESAREAN SECTION  Patient Location: 120  Anesthesia Type: Epidural  Level of Consciousness: alert  and oriented  Airway and Oxygen Therapy: Patient Spontanous Breathing  Post-op Pain: mild  Post-op Assessment: Patient's Cardiovascular Status Stable and Respiratory Function Stable  Post-op Vital Signs: stable  Complications: No apparent anesthesia complications

## 2011-08-17 NOTE — Progress Notes (Signed)
Subjective: Postpartum Day 1: Cesarean Delivery Patient reports tolerating PO.    Objective: Vital signs in last 24 hours: Temp:  [97.5 F (36.4 C)-102.3 F (39.1 C)] 98.1 F (36.7 C) (12/19 0752) Pulse Rate:  [64-128] 108  (12/19 0752) Resp:  [20-32] 22  (12/19 0752) BP: (101-163)/(38-122) 119/78 mmHg (12/19 0752) SpO2:  [97 %-100 %] 98 % (12/19 0752)  Physical Exam:  General: alert and cooperative Lochia: appropriate Uterine Fundus: firm Abd dresing CDI Foley with adequate op BS decreased DVT Evaluation: No evidence of DVT seen on physical exam.   Basename 08/17/11 0540 08/15/11 2110  HGB 10.5* 12.0  HCT 31.4* 35.6*    Assessment/Plan: Status post Cesarean section. Doing well postoperatively.  Continue current care.  Mary Ray G 08/17/2011, 8:04 AM

## 2011-08-18 MED ORDER — AMOXICILLIN-POT CLAVULANATE 875-125 MG PO TABS
1.0000 | ORAL_TABLET | Freq: Two times a day (BID) | ORAL | Status: DC
  Administered 2011-08-18 – 2011-08-19 (×3): 1 via ORAL
  Filled 2011-08-18 (×3): qty 1

## 2011-08-18 NOTE — Progress Notes (Signed)
Subjective: Postpartum Day 2: Cesarean Delivery Patient reports tolerating PO and no problems voiding.    Objective: Vital signs in last 24 hours: Temp:  [98.1 F (36.7 C)-98.7 F (37.1 C)] 98.1 F (36.7 C) (12/20 0600) Pulse Rate:  [82-115] 99  (12/20 0600) Resp:  [20-24] 20  (12/20 0600) BP: (122-134)/(68-76) 122/74 mmHg (12/20 0600) SpO2:  [99 %] 99 % (12/19 1600)  Physical Exam:  General: alert and cooperative Lochia: appropriate Uterine Fundus: firm Incision: healing well DVT Evaluation: No evidence of DVT seen on physical exam.   Basename 08/17/11 0540 08/15/11 2110  HGB 10.5* 12.0  HCT 31.4* 35.6*    Assessment/Plan: Status post Cesarean section. Doing well postoperatively.  Continue current care May discontinue IV ATB's and start Augmentin 875 bid.  Giovanni Bath G 08/18/2011, 8:13 AM

## 2011-08-19 LAB — CBC
MCH: 29.8 pg (ref 26.0–34.0)
MCHC: 33.6 g/dL (ref 30.0–36.0)
MCV: 88.7 fL (ref 78.0–100.0)
Platelets: 254 10*3/uL (ref 150–400)
RBC: 3.09 MIL/uL — ABNORMAL LOW (ref 3.87–5.11)
RDW: 14 % (ref 11.5–15.5)

## 2011-08-19 MED ORDER — AMOXICILLIN-POT CLAVULANATE 875-125 MG PO TABS: 1.0000 | ORAL_TABLET | Freq: Two times a day (BID) | ORAL | Status: AC

## 2011-08-19 MED ORDER — OXYCODONE-ACETAMINOPHEN 5-325 MG PO TABS: 1.0000 | ORAL_TABLET | ORAL | Status: AC | PRN

## 2011-08-19 MED ORDER — IBUPROFEN 600 MG PO TABS: 600.0000 mg | ORAL_TABLET | Freq: Four times a day (QID) | ORAL | Status: AC

## 2011-08-19 NOTE — Discharge Summary (Signed)
Obstetric Discharge Summary Reason for Admission: induction of labor Prenatal Procedures: ultrasound Intrapartum Procedures: cesarean: low cervical, transverse Postpartum Procedures: none Complications-Operative and Postpartum: none Hemoglobin  Date Value Range Status  08/19/2011 9.2* 12.0-15.0 (Ray/dL) Final     HCT  Date Value Range Status  08/19/2011 27.4* 36.0-46.0 (%) Final    Discharge Diagnoses: Term Pregnancy-delivered  Discharge Information: Date: 08/19/2011 Activity: pelvic rest Diet: routine Medications: PNV, Ibuprofen and Percocet Condition: stable Instructions: refer to practice specific booklet Discharge to: home   Newborn Data: Live born female  Birth Weight: 8 lb 1.6 oz (3675 Ray) APGAR: 7, 9  Home with mother.  Mary Ray 08/19/2011, 8:45 AM

## 2011-08-19 NOTE — Progress Notes (Signed)
Subjective: Postpartum Day 3: Cesarean Delivery Patient reports tolerating PO, + flatus, + BM and no problems voiding.    Objective: Vital signs in last 24 hours: Temp:  [98.2 F (36.8 C)-98.6 F (37 C)] 98.5 F (36.9 C) (12/21 0531) Pulse Rate:  [100-109] 108  (12/21 0531) Resp:  [18-20] 18  (12/21 0531) BP: (124-134)/(76-80) 124/76 mmHg (12/21 0531)  Physical Exam:  General: alert and cooperative Lochia: appropriate Uterine Fundus: firm Incision: healing well DVT Evaluation: No evidence of DVT seen on physical exam.   Basename 08/19/11 0437 08/17/11 0540  HGB 9.2* 10.5*  HCT 27.4* 31.4*    Assessment/Plan: Status post Cesarean section. Doing well postoperatively.  Discharge home with standard precautions and return to clinic in 3 days for staple removal.  Kinnley Paulson G 08/19/2011, 8:36 AM

## 2011-09-10 ENCOUNTER — Encounter (HOSPITAL_COMMUNITY): Payer: Self-pay | Admitting: *Deleted

## 2011-09-10 ENCOUNTER — Emergency Department (HOSPITAL_COMMUNITY)
Admission: EM | Admit: 2011-09-10 | Discharge: 2011-09-10 | Disposition: A | Discharge status: Home / Self Care | Payer: BC Managed Care – PPO | Attending: Emergency Medicine | Admitting: Emergency Medicine

## 2011-09-10 DIAGNOSIS — J3489 Other specified disorders of nose and nasal sinuses: Secondary | ICD-10-CM | POA: Insufficient documentation

## 2011-09-10 DIAGNOSIS — J019 Acute sinusitis, unspecified: Secondary | ICD-10-CM | POA: Insufficient documentation

## 2011-09-10 LAB — RAPID STREP SCREEN (MED CTR MEBANE ONLY): Streptococcus, Group A Screen (Direct): NEGATIVE

## 2011-09-10 MED ORDER — AZITHROMYCIN 250 MG PO TABS: 250.0000 mg | ORAL_TABLET | Freq: Every day | ORAL | Status: AC

## 2011-09-10 NOTE — ED Notes (Signed)
Pt c/o cold and flu like symptoms x 3 days.  C/o sore throat, cough, rhinorrhea, and chills.  Pt denies N/V/D.

## 2011-09-10 NOTE — ED Notes (Signed)
Pt states she has congestion, headache. Pt has been taking Claritin, Motrin and [employer] congestion/sinus meds.

## 2011-09-10 NOTE — ED Provider Notes (Signed)
History     CSN: [id number]  Arrival date & time 09/10/11  0459   First MD Initiated Contact with Patient 09/10/11 0557      Chief Complaint  Patient presents with  . Influenza    (Consider location/radiation/quality/duration/timing/severity/associated sxs/prior treatment) HPI Comments: Patient reports that 2 days ago she develop pressure over her maxillary sinuses.  Pain is worse when leaning her head forward.  Patient is a 36 y.o. female presenting with sinusitis. The history is provided by the patient.  Sinusitis  This is a new problem. The current episode started 2 days ago. The problem has been gradually worsening. There has been no fever. Associated symptoms include congestion and sinus pressure. Pertinent negatives include no chills, no sweats, no ear pain, no hoarse voice, no sore throat, no swollen glands, no cough and no shortness of breath. She has tried nothing for the symptoms.    History reviewed. No pertinent past medical history.  Past Surgical History  Procedure Date  . Breast reduction surgery   . Salpingectomy 2004    Scar tissue???  . Cesarean section 08/16/2011    Procedure: CESAREAN SECTION;  Surgeon: Juluis Mire;  Location: [facility];  Service: Gynecology;  Laterality: [name];    Family History  Problem Relation Age of Onset  . Diabetes Mother   . Hypertension Mother   . Diabetes Father   . Stroke Father   . Hypertension Father   . Hypertension Sister   . Learning disabilities Cousin     autism  . Anesthesia problems Neg Hx   . Hypotension Neg Hx   . Malignant hyperthermia Neg Hx   . Pseudochol deficiency Neg Hx     History  Substance Use Topics  . Smoking status: Never Smoker   . Smokeless tobacco: Never Used  . Alcohol Use: No    OB History    Grav Para Term Preterm Abortions TAB SAB Ect Mult Living   1 1 1  0 0 0 0 0 0 1      Review of Systems  Constitutional: Negative for fever and chills.  HENT: Positive for congestion, rhinorrhea  and sinus pressure. Negative for ear pain, sore throat, hoarse voice, neck pain and neck stiffness.   Respiratory: Negative for cough and shortness of breath.   Cardiovascular: Negative for chest pain.  Gastrointestinal: Negative for nausea and vomiting.    Allergies  Vioxx and Clindamycin/lincomycin  Home Medications   Current Outpatient Rx  Name Route Sig Dispense Refill  . IBUPROFEN 200 MG PO TABS Oral Take 600 mg by mouth every 6 (six) hours as needed.    Marland Kitchen PRENATAL PLUS 27-1 MG PO TABS Oral Take 1 tablet by mouth at bedtime.       BP 135/73  Pulse 77  Temp(Src) 98.3 F (36.8 C) (Oral)  Resp 19  SpO2 100%  Physical Exam  Nursing note and vitals reviewed. Constitutional: She is oriented to person, place, and time. She appears well-developed and well-nourished. No distress.  HENT:  Head: Normocephalic and atraumatic.  Right Ear: Tympanic membrane and external ear normal.  Left Ear: Tympanic membrane and external ear normal.  Nose: Mucosal edema and rhinorrhea present. Right sinus exhibits maxillary sinus tenderness. Left sinus exhibits maxillary sinus tenderness.  Mouth/Throat: Oropharynx is clear and moist. No oropharyngeal exudate.  Eyes: EOM are normal.  Neck: Normal range of motion. Neck supple.  Cardiovascular: Normal rate, regular rhythm and normal heart sounds.   Pulmonary/Chest: Effort normal and breath sounds  normal. No accessory muscle usage or stridor. Not tachypneic. No respiratory distress. She has no wheezes. She has no rhonchi. She has no rales. She exhibits no tenderness.  Abdominal: Soft. She exhibits no distension. There is no tenderness.  Musculoskeletal: Normal range of motion.  Lymphadenopathy:    She has no cervical adenopathy.  Neurological: She is alert and oriented to person, place, and time.  Skin: Skin is warm and dry. She is not diaphoretic.  Psychiatric: She has a normal mood and affect.    ED Course  Procedures (including critical care  time)   Labs Reviewed  RAPID STREP SCREEN   No results found.   1. Acute sinusitis       MDM  Signs and symptoms consistent with Acute Sinusitis.  Patient given Rx for Azithromycin and given home care instructions.        Pascal Lux [facility] 09/10/11 1619

## 2011-09-11 NOTE — ED Provider Notes (Signed)
Medical screening examination/treatment/procedure(s) were performed by non-physician practitioner and as supervising physician I was immediately available for consultation/collaboration.  Benjamin Merrihew K Ariabella Brien-Rasch, MD 09/11/11 0355 

## 2012-07-11 ENCOUNTER — Inpatient Hospital Stay (HOSPITAL_COMMUNITY)
Admission: AD | Admit: 2012-07-11 | Discharge: 2012-07-11 | Disposition: A | Discharge status: Hospice | Payer: Self-pay | Source: Ambulatory Visit | Attending: Obstetrics and Gynecology | Admitting: Obstetrics and Gynecology

## 2012-07-11 ENCOUNTER — Encounter (HOSPITAL_COMMUNITY): Payer: Self-pay | Admitting: *Deleted

## 2012-07-11 DIAGNOSIS — M549 Dorsalgia, unspecified: Secondary | ICD-10-CM | POA: Insufficient documentation

## 2012-07-11 DIAGNOSIS — N760 Acute vaginitis: Secondary | ICD-10-CM | POA: Insufficient documentation

## 2012-07-11 DIAGNOSIS — A499 Bacterial infection, unspecified: Secondary | ICD-10-CM

## 2012-07-11 DIAGNOSIS — R1031 Right lower quadrant pain: Secondary | ICD-10-CM | POA: Insufficient documentation

## 2012-07-11 DIAGNOSIS — R109 Unspecified abdominal pain: Secondary | ICD-10-CM

## 2012-07-11 DIAGNOSIS — B9689 Other specified bacterial agents as the cause of diseases classified elsewhere: Secondary | ICD-10-CM | POA: Insufficient documentation

## 2012-07-11 LAB — URINALYSIS, ROUTINE W REFLEX MICROSCOPIC
Ketones, ur: NEGATIVE mg/dL
Leukocytes, UA: NEGATIVE
Nitrite: NEGATIVE
Specific Gravity, Urine: 1.015 (ref 1.005–1.030)
pH: 6.5 (ref 5.0–8.0)

## 2012-07-11 LAB — CBC WITH DIFFERENTIAL/PLATELET
Basophils Relative: 0 % (ref 0–1)
Eosinophils Absolute: 0.1 10*3/uL (ref 0.0–0.7)
Eosinophils Relative: 1 % (ref 0–5)
Lymphs Abs: 2.3 10*3/uL (ref 0.7–4.0)
MCH: 28.2 pg (ref 26.0–34.0)
MCHC: 33.2 g/dL (ref 30.0–36.0)
MCV: 84.8 fL (ref 78.0–100.0)
Monocytes Relative: 7 % (ref 3–12)
Platelets: 394 10*3/uL (ref 150–400)
RBC: 4.15 MIL/uL (ref 3.87–5.11)

## 2012-07-11 LAB — POCT PREGNANCY, URINE: Preg Test, Ur: NEGATIVE

## 2012-07-11 LAB — WET PREP, GENITAL: Trich, Wet Prep: NONE SEEN

## 2012-07-11 MED ORDER — KETOROLAC TROMETHAMINE 30 MG/ML IJ SOLN
30.0000 mg | Freq: Once | INTRAMUSCULAR | Status: AC
  Administered 2012-07-11: 30 mg via INTRAMUSCULAR
  Filled 2012-07-11: qty 1

## 2012-07-11 MED ORDER — TRAMADOL HCL 50 MG PO TABS: 50.0000 mg | ORAL_TABLET | Freq: Four times a day (QID) | ORAL | Status: DC | PRN

## 2012-07-11 NOTE — MAU Note (Signed)
Pt states post menses has had intense pain on right side only wrapping around to low back. Is unable to work through pain which has been present for 1 week.

## 2012-07-11 NOTE — MAU Provider Note (Signed)
History     CSN: [id number]  Arrival date and time: 07/11/12 1027   First Provider Initiated Contact with Patient 07/11/12 113 Chandra N Affeldt 36 y.o. [id number] Unknown   Chief Complaint  Patient presents with  . Abdominal Pain   HPI Patient is a 36 yo F presenting with right sided abdominal pain that radiates to her right flank. Started 5 days ago, unchanged since then until this morning when the pain became more severe. Has not tired anything for the pain. Denies dysuria, urinary frequency, no N/V. No vaginal discharge. Denies new sex partners, states she has not been exposed to an STD. Last intercourse 2 months ago. Patient has history of c-section 2012 and left salpingectomy for scar tissue in 2004.  Pertinent Gynecological History: Menses: LMP 07/02/12, normal flow Bleeding: normal Contraception: none Sexually transmitted diseases: past history: chlamydia 15 years ago Previous GYN Procedures: none  Last pap: normal Date: 2012   History reviewed. No pertinent past medical history.  Past Surgical History  Procedure Date  . Breast reduction surgery   . Salpingectomy 2004    Scar tissue???  . Cesarean section 08/16/2011    Procedure: CESAREAN SECTION;  Surgeon: Juluis Mire;  Location: [facility];  Service: Gynecology;  Laterality: [name];  . Wisdom tooth extraction     Family History  Problem Relation Age of Onset  . Diabetes Mother   . Hypertension Mother   . Diabetes Father   . Stroke Father   . Hypertension Father   . Hypertension Sister   . Learning disabilities Cousin     autism  . Anesthesia problems Neg Hx   . Hypotension Neg Hx   . Malignant hyperthermia Neg Hx   . Pseudochol deficiency Neg Hx     History  Substance Use Topics  . Smoking status: Never Smoker   . Smokeless tobacco: Never Used  . Alcohol Use: No    Allergies:  Allergies  Allergen Reactions  . Vioxx (Rofecoxib) Hives and Rash    Patient reported it was years ago.  Pt. Says she can take  ibuprofen  . Clindamycin/Lincomycin Swelling    Facial swelling    Prescriptions prior to admission  Medication Sig Dispense Refill  . ibuprofen (ADVIL,MOTRIN) 200 MG tablet Take 600 mg by mouth every 6 (six) hours as needed.      . prenatal vitamin w/FE, FA (PRENATAL 1 + 1) 27-1 MG TABS Take 1 tablet by mouth at bedtime.         Review of Systems  Constitutional: Negative for fever and chills.  Respiratory: Negative for shortness of breath.   Cardiovascular: Negative for chest pain.  Gastrointestinal: Positive for abdominal pain. Negative for nausea and vomiting.  Genitourinary: Negative for dysuria.  Musculoskeletal: Positive for back pain.  Skin: Negative for rash.  Neurological: Negative for dizziness and headaches.   Physical Exam   Height 5\' 7"  (1.702 m), weight 142.6 kg (314 lb 6 oz), last menstrual period 07/02/2012, not currently breastfeeding.  Physical Exam  Constitutional: She is oriented to person, place, and time. She appears well-developed and well-nourished. No distress.  HENT:  Head: Normocephalic and atraumatic.  Neck: Normal range of motion.  Cardiovascular: Normal rate and regular rhythm.   No murmur heard. Respiratory: Effort normal and breath sounds normal. She has no wheezes.  GI: Soft. There is tenderness. There is rebound.       Obese, tenderness RLQ medial of the ASIS. TTP with rebound to the right when palpation  on the LLQ.  Genitourinary: Vagina normal and uterus normal.       Malodorous watery discharge. Small cyst on cervix at 2:00 position. No CMT. TTP right side on bimanual exam, but same tenderness when palpated externally only.   Musculoskeletal: Normal range of motion. She exhibits no edema and no tenderness.       Full active range of motion of the hip: extension, flexion, internal and external rotation achieved without pain.  L3-5 NSl Rr - Muscular spasm on the right.  Neurological: She is alert and oriented to person, place, and time. No  cranial nerve deficit.  Skin: Skin is dry. No rash noted.    MAU Course  Procedures Results for orders placed during the hospital encounter of 07/11/12 (from the past 24 hour(s))  URINALYSIS, ROUTINE W REFLEX MICROSCOPIC     Status: Normal   Collection Time   07/11/12 10:57 AM      Component Value Range   Color, Urine YELLOW  YELLOW   APPearance CLEAR  CLEAR   Specific Gravity, Urine 1.015  1.005 - 1.030   pH 6.5  5.0 - 8.0   Glucose, UA NEGATIVE  NEGATIVE mg/dL   Hgb urine dipstick NEGATIVE  NEGATIVE   Bilirubin Urine NEGATIVE  NEGATIVE   Ketones, ur NEGATIVE  NEGATIVE mg/dL   Protein, ur NEGATIVE  NEGATIVE mg/dL   Urobilinogen, UA 0.2  0.0 - 1.0 mg/dL   Nitrite NEGATIVE  NEGATIVE   Leukocytes, UA NEGATIVE  NEGATIVE  POCT PREGNANCY, URINE     Status: Normal   Collection Time   07/11/12 11:24 AM      Component Value Range   Preg Test, Ur NEGATIVE  NEGATIVE  Microscopic wet-mount exam shows clue cells.  MDM 12:00pm- Speculum exam performed. Will send GC/Ch and wet prep. Await results from UA and wet pretp  Assessment and Plan  36 yo F with  1. RLQ pain - Bacteria Vaginosis: Flagyl 500 mg BID x 7d - CBC WNL 2. Back pain: - Probable musculoskeletal pain - Toradol 30 mg IM 3. Discharge home _ Patient and plan reviewed with Mayer Camel, NP    HAIR[place]ORD, AMBER 07/11/2012, 11:38 AM   I have examined this patient. Her abdomen is soft, there is no tenderness with deep palpation. There is pain in the right inguinal area with range of motion and palpation. The pain increases with bending forward and any movement of the area. The patient denies nausea, vomiting or other problems. The symptoms have been off and on x 5 days.   I have been involved with the care of this patient and discussed with the Resident and agree with the above note.

## 2012-07-11 NOTE — MAU Note (Signed)
Pt c/o sharp, constant pain on her lower right side since Last Friday.  Has not tried anything for pain relief and tried to work through the pain but today has been unbearable.  Denies any bleeding or pain upon urinating.

## 2012-07-12 NOTE — MAU Provider Note (Signed)
Attestation of Attending Supervision of Advanced Practitioner (CNM/NP): Evaluation and management procedures were performed by the Advanced Practitioner under my supervision and collaboration.  I have reviewed the Advanced Practitioner's note and chart, and I agree with the management and plan.  Colten Desroches 07/12/2012 2:18 PM

## 2012-07-22 ENCOUNTER — Encounter (HOSPITAL_COMMUNITY): Payer: Self-pay | Admitting: Emergency Medicine

## 2012-07-22 ENCOUNTER — Emergency Department (HOSPITAL_COMMUNITY)
Admission: EM | Admit: 2012-07-22 | Discharge: 2012-07-22 | Disposition: A | Discharge status: Home / Self Care | Payer: Self-pay | Attending: Emergency Medicine | Admitting: Emergency Medicine

## 2012-07-22 DIAGNOSIS — H5789 Other specified disorders of eye and adnexa: Secondary | ICD-10-CM | POA: Insufficient documentation

## 2012-07-22 MED ORDER — AMOXICILLIN-POT CLAVULANATE 500-125 MG PO TABS: 1.0000 | ORAL_TABLET | Freq: Three times a day (TID) | ORAL | Status: DC

## 2012-07-22 NOTE — ED Notes (Signed)
Pt began having Right eye itrritation Wednesday, 4 days ago.. Thursday it began swelling and today it was swollen shut. Vision has not changed.  Pt states it hurts only when she looks to the right it feels like its pulling from the inner corner. Sclera white.

## 2012-07-22 NOTE — ED Provider Notes (Signed)
History     CSN: [id number]  Arrival date & time 07/22/12  7253   First MD Initiated Contact with Patient 07/22/12 0809      Chief Complaint  Patient presents with  . Eye Problem    (Consider location/radiation/quality/duration/timing/severity/associated sxs/prior treatment) HPI Pt woke with R eyelid swelling and crusty d/c. No fever, chills, visual changes. No known trauma. Pt does not wear contacts. No noticeable foreign body. History reviewed. No pertinent past medical history.  Past Surgical History  Procedure Date  . Breast reduction surgery   . Salpingectomy 2004    Scar tissue???  . Cesarean section 08/16/2011    Procedure: CESAREAN SECTION;  Surgeon: Juluis Mire;  Location: [facility];  Service: Gynecology;  Laterality: [name];  . Wisdom tooth extraction     Family History  Problem Relation Age of Onset  . Diabetes Mother   . Hypertension Mother   . Diabetes Father   . Stroke Father   . Hypertension Father   . Hypertension Sister   . Learning disabilities Cousin     autism  . Anesthesia problems Neg Hx   . Hypotension Neg Hx   . Malignant hyperthermia Neg Hx   . Pseudochol deficiency Neg Hx     History  Substance Use Topics  . Smoking status: Never Smoker   . Smokeless tobacco: Never Used  . Alcohol Use: No    OB History    Grav Para Term Preterm Abortions TAB SAB Ect Mult Living   1 1 1  0 0 0 0 0 0 1      Review of Systems  Constitutional: Negative for fever and chills.  HENT: Positive for facial swelling. Negative for congestion and rhinorrhea.   Eyes: Positive for discharge. Negative for pain and visual disturbance.    Allergies  Vioxx and Clindamycin/lincomycin  Home Medications   Current Outpatient Rx  Name  Route  Sig  Dispense  Refill  . AMOXICILLIN-POT CLAVULANATE 500-125 MG PO TABS   Oral   Take 1 tablet (500 mg total) by mouth 3 (three) times daily.   21 tablet   0   . IBUPROFEN 200 MG PO TABS   Oral   Take 600 mg by mouth  every 6 (six) hours as needed.         Marland Kitchen TRAMADOL HCL 50 MG PO TABS   Oral   Take 1 tablet (50 mg total) by mouth every 6 (six) hours as needed for pain.   30 tablet   0     BP 119/72  Pulse 75  Temp 99.2 F (37.3 C) (Oral)  Resp 18  SpO2 99%  LMP 07/02/2012  Breastfeeding? Unknown  Physical Exam  Nursing note and vitals reviewed. Constitutional: She is oriented to person, place, and time. She appears well-developed and well-nourished. No distress.  HENT:  Head: Normocephalic and atraumatic.  Mouth/Throat: Oropharynx is clear and moist.  Eyes: EOM are normal. Pupils are equal, round, and reactive to light.       No pain with ROM of eyes. Mild R conjunctival irritation. No foreign bodies. R upper eyelid swelling and erythema. No discreet masses. Mild TTP over eyelid. No d/c noted.   Neck: Normal range of motion. Neck supple.  Cardiovascular: Normal rate.   Pulmonary/Chest: Effort normal.  Abdominal: Soft.  Musculoskeletal: Normal range of motion.  Lymphadenopathy:    She has no cervical adenopathy.  Neurological: She is alert and oriented to person, place, and time.  Moves all ext without deficit  Skin: Skin is warm and dry. No rash noted. There is erythema.  Psychiatric: She has a normal mood and affect. Her behavior is normal.    ED Course  Procedures (including critical care time)  Labs Reviewed - No data to display No results found.   1. Periorbital swelling       MDM  Question early periorbital cellulitis. Do not believe orbital cellulitis due to painless ROM. I have given strict instructions to return immediately for worsening redness, pain, or for fever chills changes in vision. Have given f/u with opthalmology if symptoms not improving in 1-2 days.         Loren Racer, MD 07/22/12 [phone]

## 2012-07-22 NOTE — ED Notes (Signed)
Pt states while at work on Wednesday she thinks she got dust from bed linens in her right eye, was hurting and slightly painful. Since then pain has increased, also has swelling of eye lid with pain, unable to open w/o soaking and pulling open this morning.

## 2012-12-20 ENCOUNTER — Emergency Department (HOSPITAL_BASED_OUTPATIENT_CLINIC_OR_DEPARTMENT_OTHER)
Admission: EM | Admit: 2012-12-20 | Discharge: 2012-12-20 | Disposition: A | Discharge status: Home / Self Care | Payer: Self-pay | Attending: Emergency Medicine | Admitting: Emergency Medicine

## 2012-12-20 ENCOUNTER — Encounter (HOSPITAL_BASED_OUTPATIENT_CLINIC_OR_DEPARTMENT_OTHER): Payer: Self-pay

## 2012-12-20 DIAGNOSIS — H1045 Other chronic allergic conjunctivitis: Secondary | ICD-10-CM | POA: Insufficient documentation

## 2012-12-20 DIAGNOSIS — H1011 Acute atopic conjunctivitis, right eye: Secondary | ICD-10-CM

## 2012-12-20 MED ORDER — TETRACAINE HCL 0.5 % OP SOLN
OPHTHALMIC | Status: AC
  Administered 2012-12-20: 11:00:00
  Filled 2012-12-20: qty 2

## 2012-12-20 MED ORDER — SULFACETAMIDE SODIUM 10 % OP SOLN
2.0000 [drp] | OPHTHALMIC | Status: DC
  Administered 2012-12-20: 2 [drp] via OPHTHALMIC
  Filled 2012-12-20: qty 15

## 2012-12-20 MED ORDER — LORATADINE 10 MG PO TABS: 10.0000 mg | ORAL_TABLET | Freq: Every day | ORAL | Status: DC

## 2012-12-20 MED ORDER — FLUORESCEIN SODIUM 1 MG OP STRP
ORAL_STRIP | OPHTHALMIC | Status: AC
  Administered 2012-12-20: 11:00:00
  Filled 2012-12-20: qty 1

## 2012-12-20 NOTE — ED Notes (Signed)
Pt  Reports swelling and pain in right eye

## 2012-12-20 NOTE — ED Provider Notes (Signed)
History     CSN: [id number]  Arrival date & time 12/20/12  0904   First MD Initiated Contact with Patient 12/20/12 0935      Chief Complaint  Patient presents with  . Eye Problem    (Consider location/radiation/quality/duration/timing/severity/associated sxs/prior treatment) Patient is a 37 y.o. female presenting with eye problem.  Eye Problem  Pt reports she has had 2 days of puffiness in R eye associated with foreign body sensation but no discharge. No injury. Does not wear contact lenses. She has used OTC eye drops without relief.   History reviewed. No pertinent past medical history.  Past Surgical History  Procedure Laterality Date  . Breast reduction surgery    . Salpingectomy  2004    Scar tissue???  . Cesarean section  08/16/2011    Procedure: CESAREAN SECTION;  Surgeon: Juluis Mire;  Location: [facility];  Service: Gynecology;  Laterality: [name];  . Wisdom tooth extraction      Family History  Problem Relation Age of Onset  . Diabetes Mother   . Hypertension Mother   . Diabetes Father   . Stroke Father   . Hypertension Father   . Hypertension Sister   . Learning disabilities Cousin     autism  . Anesthesia problems Neg Hx   . Hypotension Neg Hx   . Malignant hyperthermia Neg Hx   . Pseudochol deficiency Neg Hx     History  Substance Use Topics  . Smoking status: Never Smoker   . Smokeless tobacco: Never Used  . Alcohol Use: No    OB History   Grav Para Term Preterm Abortions TAB SAB Ect Mult Living   1 1 1  0 0 0 0 0 0 1      Review of Systems All other systems reviewed and are negative except as noted in HPI.   Allergies  Vioxx and Clindamycin/lincomycin  Home Medications  No current outpatient prescriptions on file.  BP 121/57  Pulse 84  Temp(Src) 98.5 F (36.9 C) (Oral)  Resp 18  Ht 5\' 7"  (1.702 m)  Wt 300 lb (136.079 kg)  BMI 46.98 kg/m2  SpO2 97%  Physical Exam  Nursing note and vitals reviewed. Constitutional: She is oriented  to person, place, and time. She appears well-developed and well-nourished.  HENT:  Head: Normocephalic and atraumatic.  Eyes: EOM are normal. Pupils are equal, round, and reactive to light.  No pain with EOM, there is moderate swelling of R lower lid but no periorbital swelling, no discharge, mild conjunctival injection, anterior chamber is clear, no corneal abrasions on fluorescein exam; no foreign body  Neck: Normal range of motion. Neck supple.  Cardiovascular: Normal rate, normal heart sounds and intact distal pulses.   Pulmonary/Chest: Effort normal and breath sounds normal.  Abdominal: Bowel sounds are normal. She exhibits no distension. There is no tenderness.  Musculoskeletal: Normal range of motion. She exhibits no edema and no tenderness.  Neurological: She is alert and oriented to person, place, and time. She has normal strength. No cranial nerve deficit or sensory deficit.  Skin: Skin is warm and dry. No rash noted.  Psychiatric: She has a normal mood and affect.    ED Course  Procedures (including critical care time)  Labs Reviewed - No data to display No results found.   1. Allergic conjunctivitis, right       MDM  Probable allergic conjunctivitis, may be early infectious conjunctivitis but doubt periorbital cellulitis. Will Rx eye drops, advised antihistamine as  well.         Bonnita Levan. Bernette Mayers, MD 12/20/12 1051

## 2012-12-21 ENCOUNTER — Encounter (HOSPITAL_BASED_OUTPATIENT_CLINIC_OR_DEPARTMENT_OTHER): Payer: Self-pay | Admitting: *Deleted

## 2012-12-21 ENCOUNTER — Emergency Department (HOSPITAL_BASED_OUTPATIENT_CLINIC_OR_DEPARTMENT_OTHER)
Admission: EM | Admit: 2012-12-21 | Discharge: 2012-12-21 | Disposition: A | Discharge status: Home / Self Care | Payer: Self-pay | Attending: Emergency Medicine | Admitting: Emergency Medicine

## 2012-12-21 DIAGNOSIS — L0201 Cutaneous abscess of face: Secondary | ICD-10-CM | POA: Insufficient documentation

## 2012-12-21 DIAGNOSIS — L03211 Cellulitis of face: Secondary | ICD-10-CM | POA: Insufficient documentation

## 2012-12-21 MED ORDER — AMOXICILLIN-POT CLAVULANATE 875-125 MG PO TABS
1.0000 | ORAL_TABLET | Freq: Once | ORAL | Status: AC
  Administered 2012-12-21: 1 via ORAL
  Filled 2012-12-21: qty 1

## 2012-12-21 MED ORDER — AMOXICILLIN-POT CLAVULANATE 875-125 MG PO TABS: 1.0000 | ORAL_TABLET | Freq: Two times a day (BID) | ORAL | Status: DC

## 2012-12-21 NOTE — ED Notes (Signed)
Pt c/o right eye swelling an dpain that began 2 days ago. Pt seen here yesterday for same but sts the meds are not helping.

## 2012-12-21 NOTE — ED Provider Notes (Signed)
History     CSN: [id number]  Arrival date & time 12/21/12  0449   First MD Initiated Contact with Patient 12/21/12 0501      Chief Complaint  Patient presents with  . Eye Pain    (Consider location/radiation/quality/duration/timing/severity/associated sxs/prior treatment) HPI This is a 37 year old female with a two-day history of pain and swelling in her right lower eyelid. She was seen for this yesterday morning and diagnosed with a conjunctivitis, allergic versus early bacterial. She was placed on sulfacetamide eyedrops and Claritin. Since then the right lower eyelid has gotten more painful, more swollen, more erythematous and the edema is now spreading to the right cheek. She denies pain behind the eye or with movement of the eye. She does have a foreign body sensation of the right eye. Symptoms are moderate. Her vision is not significantly blurred.  History reviewed. No pertinent past medical history.  Past Surgical History  Procedure Laterality Date  . Breast reduction surgery    . Salpingectomy  2004    Scar tissue???  . Cesarean section  08/16/2011    Procedure: CESAREAN SECTION;  Surgeon: Juluis Mire;  Location: [facility];  Service: Gynecology;  Laterality: [name];  . Wisdom tooth extraction      Family History  Problem Relation Age of Onset  . Diabetes Mother   . Hypertension Mother   . Diabetes Father   . Stroke Father   . Hypertension Father   . Hypertension Sister   . Learning disabilities Cousin     autism  . Anesthesia problems Neg Hx   . Hypotension Neg Hx   . Malignant hyperthermia Neg Hx   . Pseudochol deficiency Neg Hx     History  Substance Use Topics  . Smoking status: Never Smoker   . Smokeless tobacco: Never Used  . Alcohol Use: No    OB History   Grav Para Term Preterm Abortions TAB SAB Ect Mult Living   1 1 1  0 0 0 0 0 0 1      Review of Systems  All other systems reviewed and are negative.    Allergies  Vioxx and  Clindamycin/lincomycin  Home Medications   Current Outpatient Rx  Name  Route  Sig  Dispense  Refill  . loratadine (CLARITIN) 10 MG tablet   Oral   Take 1 tablet (10 mg total) by mouth daily.   30 tablet   0     BP 141/81  Pulse 76  Temp(Src) 99 F (37.2 C) (Oral)  Resp 18  Wt 300 lb (136.079 kg)  BMI 46.98 kg/m2  SpO2 98%  Physical Exam General: Well-developed, well-nourished female in no acute distress; appearance consistent with age of record HENT: normocephalic, atraumatic Eyes: pupils equal round and reactive to light; extraocular muscles intact; no erythema; no hypopyon; right conjunctival injection; tenderness, edema and erythema of right lower eyelid with nontender edema extending to the right cheek Neck: supple Heart: regular rate and rhythm Lungs: clear to auscultation bilaterally Abdomen: soft; nondistended Extremities: No deformity; full range of motion; pulses normal Neurologic: Awake, alert and oriented; motor function intact in all extremities and symmetric; no facial droop Skin: Warm and dry Psychiatric: Normal mood and affect    ED Course  Procedures (including critical care time)    MDM  The patient is failed outpatient therapy with topical antibiotic. On reading the note from yesterday her conjunctiva concordia have not significantly changed. The primary changes than to the right lower lid and  face. This is concerning for periorbital sialitis we will start her on antibiotics for this. We will have her return in 24 hours if she is not getting better.        Hanley Seamen, MD 12/21/12 [phone]

## 2013-05-31 ENCOUNTER — Encounter (HOSPITAL_COMMUNITY): Payer: Self-pay | Admitting: Emergency Medicine

## 2013-05-31 ENCOUNTER — Emergency Department (HOSPITAL_COMMUNITY)
Admission: EM | Admit: 2013-05-31 | Discharge: 2013-05-31 | Disposition: A | Discharge status: Home / Self Care | Payer: BC Managed Care – PPO | Attending: Emergency Medicine | Admitting: Emergency Medicine

## 2013-05-31 ENCOUNTER — Emergency Department (HOSPITAL_COMMUNITY): Payer: BC Managed Care – PPO

## 2013-05-31 DIAGNOSIS — R509 Fever, unspecified: Secondary | ICD-10-CM | POA: Insufficient documentation

## 2013-05-31 DIAGNOSIS — Z3202 Encounter for pregnancy test, result negative: Secondary | ICD-10-CM | POA: Insufficient documentation

## 2013-05-31 DIAGNOSIS — R1032 Left lower quadrant pain: Secondary | ICD-10-CM | POA: Insufficient documentation

## 2013-05-31 DIAGNOSIS — R630 Anorexia: Secondary | ICD-10-CM | POA: Insufficient documentation

## 2013-05-31 DIAGNOSIS — J029 Acute pharyngitis, unspecified: Secondary | ICD-10-CM | POA: Insufficient documentation

## 2013-05-31 DIAGNOSIS — R110 Nausea: Secondary | ICD-10-CM | POA: Insufficient documentation

## 2013-05-31 DIAGNOSIS — R109 Unspecified abdominal pain: Secondary | ICD-10-CM

## 2013-05-31 DIAGNOSIS — K5900 Constipation, unspecified: Secondary | ICD-10-CM

## 2013-05-31 LAB — URINALYSIS, ROUTINE W REFLEX MICROSCOPIC
Glucose, UA: NEGATIVE mg/dL
Leukocytes, UA: NEGATIVE
Protein, ur: NEGATIVE mg/dL
pH: 7 (ref 5.0–8.0)

## 2013-05-31 LAB — COMPREHENSIVE METABOLIC PANEL
Albumin: 3.4 g/dL — ABNORMAL LOW (ref 3.5–5.2)
BUN: 8 mg/dL (ref 6–23)
Calcium: 9 mg/dL (ref 8.4–10.5)
Creatinine, Ser: 0.71 mg/dL (ref 0.50–1.10)
Total Bilirubin: 0.3 mg/dL (ref 0.3–1.2)
Total Protein: 7.6 g/dL (ref 6.0–8.3)

## 2013-05-31 LAB — POCT PREGNANCY, URINE: Preg Test, Ur: NEGATIVE

## 2013-05-31 LAB — CBC WITH DIFFERENTIAL/PLATELET
Basophils Relative: 0 % (ref 0–1)
Eosinophils Absolute: 0 10*3/uL (ref 0.0–0.7)
Eosinophils Relative: 0 % (ref 0–5)
HCT: 34.4 % — ABNORMAL LOW (ref 36.0–46.0)
Hemoglobin: 11.3 g/dL — ABNORMAL LOW (ref 12.0–15.0)
MCH: 27.4 pg (ref 26.0–34.0)
MCHC: 32.8 g/dL (ref 30.0–36.0)
Monocytes Absolute: 0.9 10*3/uL (ref 0.1–1.0)
Monocytes Relative: 8 % (ref 3–12)

## 2013-05-31 LAB — LIPASE, BLOOD: Lipase: 21 U/L (ref 11–59)

## 2013-05-31 MED ORDER — SODIUM CHLORIDE 0.9 % IV BOLUS (SEPSIS)
1000.0000 mL | Freq: Once | INTRAVENOUS | Status: AC
  Administered 2013-05-31: 1000 mL via INTRAVENOUS

## 2013-05-31 MED ORDER — IBUPROFEN 800 MG PO TABS
800.0000 mg | ORAL_TABLET | Freq: Once | ORAL | Status: AC
  Administered 2013-05-31: 800 mg via ORAL
  Filled 2013-05-31: qty 1

## 2013-05-31 MED ORDER — ACETAMINOPHEN 325 MG PO TABS
650.0000 mg | ORAL_TABLET | Freq: Once | ORAL | Status: AC
  Administered 2013-05-31: 650 mg via ORAL
  Filled 2013-05-31: qty 2

## 2013-05-31 MED ORDER — ONDANSETRON HCL 4 MG/2ML IJ SOLN
4.0000 mg | Freq: Once | INTRAMUSCULAR | Status: AC
  Administered 2013-05-31: 4 mg via INTRAVENOUS
  Filled 2013-05-31: qty 2

## 2013-05-31 MED ORDER — MORPHINE SULFATE 4 MG/ML IJ SOLN
4.0000 mg | Freq: Once | INTRAMUSCULAR | Status: AC
  Administered 2013-05-31: 4 mg via INTRAVENOUS
  Filled 2013-05-31: qty 1

## 2013-05-31 MED ORDER — IOHEXOL 300 MG/ML  SOLN
100.0000 mL | Freq: Once | INTRAMUSCULAR | Status: AC | PRN
  Administered 2013-05-31: 100 mL via INTRAVENOUS

## 2013-05-31 MED ORDER — MAGNESIUM CITRATE PO SOLN
1.0000 | Freq: Once | ORAL | Status: AC
  Administered 2013-05-31: 1 via ORAL
  Filled 2013-05-31: qty 296

## 2013-05-31 MED ORDER — IOHEXOL 300 MG/ML  SOLN
50.0000 mL | Freq: Once | INTRAMUSCULAR | Status: AC | PRN
  Administered 2013-05-31: 50 mL via ORAL

## 2013-05-31 NOTE — ED Provider Notes (Addendum)
CSN: [id number]     Arrival date & time 05/31/13  0043 History   First MD Initiated Contact with Patient 05/31/13 0125     Chief Complaint  Patient presents with  . Abdominal Pain   (Consider location/radiation/quality/duration/timing/severity/associated sxs/prior Treatment) Patient is a 37 y.o. female presenting with abdominal pain. The history is provided by the patient.  Abdominal Pain Pain location:  LLQ Pain quality: aching, gnawing, sharp and stabbing   Pain radiates to:  LUQ Pain severity:  Moderate Onset quality:  Gradual Duration:  5 days Timing:  Constant Progression:  Worsening Chronicity:  New Context: suspicious food intake   Context: not medication withdrawal, not previous surgeries, not recent travel and not sick contacts   Context comment:  Felt that it started after eating tuna fish but denies the meal tasting bad Relieved by:  Nothing Worsened by:  Eating Ineffective treatments:  None tried Associated symptoms: anorexia, chills, constipation, fever, nausea and sore throat   Associated symptoms: no cough, no diarrhea, no hematuria, no shortness of breath, no vaginal bleeding, no vaginal discharge and no vomiting   Fever:    Duration:  1 day   Max temp PTA (F):  103   Temp source:  Oral Risk factors: obesity   Risk factors: no alcohol abuse, no aspirin use, has not had multiple surgeries and no NSAID use     History reviewed. No pertinent past medical history. Past Surgical History  Procedure Laterality Date  . Breast reduction surgery    . Salpingectomy  2004    Scar tissue???  . Cesarean section  08/16/2011    Procedure: CESAREAN SECTION;  Surgeon: Juluis Mire;  Location: [facility];  Service: Gynecology;  Laterality: [name];  . Wisdom tooth extraction     Family History  Problem Relation Age of Onset  . Diabetes Mother   . Hypertension Mother   . Diabetes Father   . Stroke Father   . Hypertension Father   . Hypertension Sister   . Learning  disabilities Cousin     autism  . Anesthesia problems Neg Hx   . Hypotension Neg Hx   . Malignant hyperthermia Neg Hx   . Pseudochol deficiency Neg Hx    History  Substance Use Topics  . Smoking status: Never Smoker   . Smokeless tobacco: Never Used  . Alcohol Use: No   OB History   Grav Para Term Preterm Abortions TAB SAB Ect Mult Living   1 1 1  0 0 0 0 0 0 1     Review of Systems  Constitutional: Positive for fever and chills.  HENT: Positive for sore throat.   Respiratory: Negative for cough and shortness of breath.   Gastrointestinal: Positive for nausea, abdominal pain, constipation and anorexia. Negative for vomiting and diarrhea.  Genitourinary: Negative for hematuria, vaginal bleeding and vaginal discharge.  All other systems reviewed and are negative.    Allergies  Vioxx and Clindamycin/lincomycin  Home Medications  No current outpatient prescriptions on file. BP 122/61  Pulse 120  Temp(Src) 103.2 F (39.6 C) (Oral)  Resp 16  Ht 5\' 7"  (1.702 m)  Wt 300 lb (136.079 kg)  BMI 46.98 kg/m2 Physical Exam  Nursing note and vitals reviewed. Constitutional: She is oriented to person, place, and time. She appears well-developed and well-nourished. No distress.  HENT:  Head: Normocephalic and atraumatic.  Right Ear: Tympanic membrane and ear canal normal.  Left Ear: Tympanic membrane and ear canal normal.  Mouth/Throat: Mucous membranes  are normal. Posterior oropharyngeal erythema present. No oropharyngeal exudate or tonsillar abscesses.  Drainage down the back of throat  Eyes: Conjunctivae and EOM are normal. Pupils are equal, round, and reactive to light.  Neck: Normal range of motion. Neck supple.  Cardiovascular: Normal rate, regular rhythm and intact distal pulses.   No murmur heard. Pulmonary/Chest: Effort normal and breath sounds normal. No respiratory distress. She has no wheezes. She has no rales.  Abdominal: Soft. She exhibits no distension. There is  tenderness in the left lower quadrant. There is guarding. There is no rebound.  Musculoskeletal: Normal range of motion. She exhibits no edema and no tenderness.  Lymphadenopathy:    She has no cervical adenopathy.  Neurological: She is alert and oriented to person, place, and time.  Skin: Skin is warm and dry. No rash noted. No erythema.  Psychiatric: She has a normal mood and affect. Her behavior is normal.    ED Course  Procedures (including critical care time) Labs Review Labs Reviewed  CBC WITH DIFFERENTIAL - Abnormal; Notable for the following:    WBC 11.5 (*)    Hemoglobin 11.3 (*)    HCT 34.4 (*)    Neutrophils Relative % 85 (*)    Neutro Abs 9.8 (*)    Lymphocytes Relative 7 (*)    All other components within normal limits  COMPREHENSIVE METABOLIC PANEL - Abnormal; Notable for the following:    Sodium 132 (*)    Glucose, Bld 112 (*)    Albumin 3.4 (*)    All other components within normal limits  URINALYSIS, ROUTINE W REFLEX MICROSCOPIC - Abnormal; Notable for the following:    APPearance CLOUDY (*)    All other components within normal limits  RAPID STREP SCREEN  CULTURE, GROUP A STREP  LIPASE, BLOOD  POCT PREGNANCY, URINE   Imaging Review Dg Chest 2 View  05/31/2013   *RADIOLOGY REPORT*  Clinical Data: Fever  CHEST - 2 VIEW  Comparison: Prior radiograph from/28/11  Findings: Cardiac and mediastinal silhouettes are stable in size and contour, and remain within normal limits.  The lungs are normally inflated.  No focal infiltrate to suggest acute infectious pneumonitis is identified.  There is no pulmonary edema or pleural effusion.  No pneumothorax.  Osseous structures are within normal limits.  IMPRESSION: No focal infiltrate to suggest acute infectious pneumonitis identified.   Original Report Authenticated By: Rise Mu, M.D.   Ct Abdomen Pelvis W Contrast  05/31/2013   CLINICAL DATA:  Fever, abdominal pain.  EXAM: CT ABDOMEN AND PELVIS WITH CONTRAST   TECHNIQUE: Multidetector CT imaging of the abdomen and pelvis was performed using the standard protocol following bolus administration of intravenous contrast.  CONTRAST:  OMNIPAQUE IOHEXOL 300 MG/ML  SOLN  COMPARISON:  Ultrasound 03/16/2011  FINDINGS: Lung bases are clear. No effusions. Heart is normal size.  Liver, gallbladder, spleen, pancreas, adrenals and kidneys are unremarkable. No renal or ureteral stones. No hydronephrosis. Small cysts or follicles in the ovaries bilaterally. Uterus and urinary bladder unremarkable. Trace free fluid in the pelvis.  Appendix is visualized and is normal. Stomach, large and small bowel unremarkable. No acute bony abnormality.  IMPRESSION: No acute findings.  Normal appendix.  Small bilateral ovarian cysts.   Electronically Signed   By: Charlett Nose M.D.   On: 05/31/2013 04:09    MDM   1. Fever   2. Constipation   3. Abdominal  pain, other specified site     Patient with 2 separate  issues initially with abdominal pain in the left upper and lower quadrants starting approximately 4 days ago and worsening yesterday with nausea but no vomiting and no diarrhea. The patient developed a fever yesterday up 103. Patient has significant left lower quadrant tenderness on exam but denies any urinary symptoms her vaginal pain.  Secondly patient states yesterday she developed a sore throat without cough and body aches. On exam patient does have pharyngeal erythema but rapid strep negative. Patient initially treated with IV fluids pain and nausea control. Concern for abdominal pathology such as diverticulitis given the left lower quadrant pain and fever. Also could be pancreatitis or other abdominal pathology. Low suspicion for pelvic origin of pain and fever at this time.  CBC, CMP, UA, lipase pending.  4:08 AM Mild leukocytosis of 11 but o/w labs unrevealing.  CT of abd/pelvis pending.  5:06 AM CT neg for acute abd pathology and CXR without PNA.  Will treat for  constipation and d/c home with supportive care.  Gwyneth Sprout, MD 05/31/13 1914  Gwyneth Sprout, MD 05/31/13 [phone]

## 2013-05-31 NOTE — ED Notes (Signed)
Bed: WA17 Expected date:  Expected time:  Means of arrival:  Comments: Clean with no monitor

## 2013-05-31 NOTE — ED Notes (Signed)
POCT PREG resulted Neg. 

## 2013-05-31 NOTE — ED Notes (Signed)
Per pt: woke up Thursday morning with abdominal pain, and fever.

## 2013-06-01 LAB — CULTURE, GROUP A STREP

## 2013-06-02 ENCOUNTER — Telehealth (HOSPITAL_COMMUNITY): Payer: Self-pay | Admitting: Emergency Medicine

## 2013-06-02 NOTE — Progress Notes (Signed)
ED Antimicrobial Stewardship Positive Culture Follow Up   Tanaiya N Cunanan is an 37 y.o. female who presented to [facility] on 05/31/2013 with a chief complaint of  Chief Complaint  Patient presents with  . Abdominal Pain  Fever, sore throat, body aches  Recent Results (from the past 720 hour(s))  RAPID STREP SCREEN     Status: None   Collection Time    05/31/13  1:48 AM      Result Value Range Status   Streptococcus, Group A Screen (Direct) NEGATIVE  NEGATIVE Final   Comment: (NOTE)     A Rapid Antigen test may result negative if the antigen level in the     sample is below the detection level of this test. The FDA has not     cleared this test as a stand-alone test therefore the rapid antigen     negative result has reflexed to a Group A Strep culture.  CULTURE, GROUP A STREP     Status: None   Collection Time    05/31/13  1:48 AM      Result Value Range Status   Specimen Description THROAT   Final   Special Requests NONE   Final   Culture     Final   Value: STREPTOCOCCUS,BETA HEMOLYIC NOT GROUP A     Performed at [facility]   Report Status 06/01/2013 FINAL   Final    []  Treated with, organism resistant to prescribed antimicrobial [x]  Patient discharged originally without antimicrobial agent and treatment is now indicated  New antibiotic prescription: Penicillin V Potassium 500 mg PO every 12 hours for 10 days  ED Provider: Fayrene Helper, PA-C  Abran Duke 06/02/2013, 10:57 AM Infectious Diseases Pharmacist Phone# [phone]

## 2013-06-02 NOTE — ED Notes (Signed)
No phone number provided. Will send letter.

## 2013-06-02 NOTE — ED Notes (Signed)
Post ED Visit - Positive Culture Follow-up: Successful Patient Follow-Up  Culture assessed and recommendations reviewed by: []  Wes Dulaney, Pharm.D., BCPS []  Celedonio Miyamoto, Pharm.D., BCPS []  Georgina Pillion, Pharm.D., BCPS []  [place], [place].D., BCPS, AAHIVP []  Estella Husk, Pharm.D., BCPS, AAHIVP []  Abran Duke, [address].D., BCPS  Positive strep culture  [x]  Patient discharged without antimicrobial prescription and treatment is now indicated []  Organism is resistant to prescribed ED discharge antimicrobial []  Patient with positive blood cultures  Changes discussed with ED provider: Fayrene Helper PA-C New antibiotic prescription: Penicillin V Potassium 500 mg PO  every 12 hrs for 10 days     Mary Ray 06/02/2013, 12:29 PM

## 2013-06-03 NOTE — ED Notes (Signed)
Letter sent to [employer] address 

## 2013-07-12 ENCOUNTER — Telehealth (HOSPITAL_COMMUNITY): Payer: Self-pay | Admitting: Emergency Medicine

## 2013-07-12 NOTE — ED Notes (Signed)
No response to letter sent after 30 days. Chart sent to Medical Records. °

## 2013-10-28 ENCOUNTER — Emergency Department (HOSPITAL_COMMUNITY)
Admission: EM | Admit: 2013-10-28 | Discharge: 2013-10-28 | Disposition: A | Discharge status: Home / Self Care | Payer: BC Managed Care – PPO | Attending: Emergency Medicine | Admitting: Emergency Medicine

## 2013-10-28 ENCOUNTER — Encounter (HOSPITAL_COMMUNITY): Payer: Self-pay | Admitting: Emergency Medicine

## 2013-10-28 ENCOUNTER — Emergency Department (HOSPITAL_COMMUNITY): Payer: BC Managed Care – PPO

## 2013-10-28 DIAGNOSIS — IMO0002 Reserved for concepts with insufficient information to code with codable children: Secondary | ICD-10-CM | POA: Insufficient documentation

## 2013-10-28 DIAGNOSIS — Y9301 Activity, walking, marching and hiking: Secondary | ICD-10-CM | POA: Insufficient documentation

## 2013-10-28 DIAGNOSIS — Y929 Unspecified place or not applicable: Secondary | ICD-10-CM | POA: Insufficient documentation

## 2013-10-28 DIAGNOSIS — S43401A Unspecified sprain of right shoulder joint, initial encounter: Secondary | ICD-10-CM

## 2013-10-28 DIAGNOSIS — W010XXA Fall on same level from slipping, tripping and stumbling without subsequent striking against object, initial encounter: Secondary | ICD-10-CM | POA: Insufficient documentation

## 2013-10-28 MED ORDER — CYCLOBENZAPRINE HCL 10 MG PO TABS: 10.0000 mg | ORAL_TABLET | Freq: Two times a day (BID) | ORAL | Status: DC | PRN

## 2013-10-28 MED ORDER — IBUPROFEN 800 MG PO TABS: 800.0000 mg | ORAL_TABLET | Freq: Three times a day (TID) | ORAL | Status: DC

## 2013-10-28 NOTE — ED Notes (Signed)
Patient transported to X-ray 

## 2013-10-28 NOTE — ED Provider Notes (Addendum)
CSN: [id number]     Arrival date & time 10/28/13  0714 History   First MD Initiated Contact with Patient 10/28/13 [phone]     No chief complaint on file.    (Consider location/radiation/quality/duration/timing/severity/associated sxs/prior Treatment) Patient is a 38 y.o. female presenting with shoulder injury. The history is provided by the patient.  Shoulder Injury This is a new (walking to her car on friday and slipped on ice and fell backwards but she was holding onto her child so her shoulder twisted.) problem. Episode onset: 3 days ago. The problem occurs constantly. The problem has been gradually worsening. Associated symptoms comments: No numbness, weakness.  No neck pain.  No LOC.. 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 The symptoms are relieved by rest. Treatments tried: nsaids. The treatment provided mild relief.    No past medical history on file. Past Surgical History  Procedure Laterality Date  . Breast reduction surgery    . Salpingectomy  2004    Scar tissue???  . Cesarean section  08/16/2011    Procedure: CESAREAN SECTION;  Surgeon: Darlyn Chamber;  Location: [facility];  Service: Gynecology;  Laterality: [name];  . Wisdom tooth extraction     Family History  Problem Relation Age of Onset  . Diabetes Mother   . Hypertension Mother   . Diabetes Father   . Stroke Father   . Hypertension Father   . Hypertension Sister   . Learning disabilities Cousin     autism  . Anesthesia problems Neg Hx   . Hypotension Neg Hx   . Malignant hyperthermia Neg Hx   . Pseudochol deficiency Neg Hx    History  Substance Use Topics  . Smoking status: Never Smoker   . Smokeless tobacco: Never Used  . Alcohol Use: No   OB History   Grav Para Term Preterm Abortions TAB SAB Ect Mult Living   1 1 1  0 0 0 0 0 0 1     Review of Systems  All other systems reviewed and are negative.      Allergies    Vioxx and Clindamycin/lincomycin  Home Medications  No current outpatient prescriptions on file. There were no vitals taken for this visit. Physical Exam  Nursing note and vitals reviewed. Constitutional: She is oriented to person, place, and time. She appears well-developed and well-nourished. No distress.  HENT:  Head: Normocephalic and atraumatic.  Eyes: EOM are normal. Pupils are equal, round, and reactive to light.  Cardiovascular: Normal rate.   Pulmonary/Chest: Effort normal.  Musculoskeletal:       Right shoulder: She exhibits decreased range of motion, tenderness and spasm. She exhibits no bony tenderness, normal pulse and normal strength.       Arms: Neurological: She is alert and oriented to person, place, and time.  Skin: Skin is warm and dry.  Psychiatric: She has a normal mood and affect. Her behavior is normal.    ED Course  Procedures (including critical care time) Labs Review Labs Reviewed - No data to display Imaging Review Dg Shoulder Right  10/28/2013   CLINICAL DATA:  Pain post trauma  EXAM: RIGHT SHOULDER - 2+ VIEW  COMPARISON:  None.  FINDINGS: Frontal, Y scapular, and axillary images were obtained. No fracture or dislocation. Joint spaces appear intact. No erosive change.  IMPRESSION: No abnormality noted.   Electronically Signed   By: Lowella Grip M.D.   On: 10/28/2013  08:06     EKG Interpretation None      MDM   Final diagnoses:  Sprain of shoulder, right    Patient with a mechanical fall 3 days ago with worsening right shoulder pain. No LOC, head, neck or back injury from the fall. Only abnormal physical exam finding is with the right shoulder. Neurologically intact with normal grip and sensation in the right hand. 2+ radial pulse. No wrist or elbow pain. Patient has significant tenderness around the anterior rotator cuff and a.c. joint. No clavicular tenderness and only mild trapezial tenderness. Significant pain with passive and active  abduction and internal rotation. Plain films pending  8:20 AM Plain films neg.  Will d/c with supportive care and ortho f/u.  Blanchie Dessert, MD 10/28/13 [place], MD 10/28/13 [phone]

## 2013-10-28 NOTE — ED Notes (Signed)
MD at bedside. 

## 2013-10-28 NOTE — ED Notes (Signed)
Pt fall Friday landed flat on back. No LOC . C/o of rt shoulder pain. EDP Plunkett in to see and evaluate pt

## 2013-10-28 NOTE — Discharge Instructions (Signed)
Acromioclavicular Injuries °The AC (acromioclavicular) joint is the joint in the shoulder where the collarbone (clavicle) meets the shoulder blade (scapula). The part of the shoulder blade connected to the collarbone is called the acromion. Common problems with and treatments for the AC joint are detailed below. °ARTHRITIS °Arthritis occurs when the joint has been injured and the smooth padding between the joints (cartilage) is lost. This is the wear and tear seen in most joints of the body if they have been overused. This causes the joint to produce pain and swelling which is worse with activity.  °AC JOINT SEPARATION °AC joint separation means that the ligaments connecting the acromion of the shoulder blade and collarbone have been damaged, and the two bones no longer line up. AC separations can be anywhere from mild to severe, and are "graded" depending upon which ligaments are torn and how badly they are torn. °· Grade I Injury: the least damage is done, and the AC joint still lines up. °· Grade II Injury: damage to the ligaments which reinforce the AC joint. In a Grade II injury, these ligaments are stretched but not entirely torn. When stressed, the AC joint becomes painful and unstable. °· Grade III Injury: AC and secondary ligaments are completely torn, and the collarbone is no longer attached to the shoulder blade. This results in deformity; a prominence of the end of the clavicle. °AC JOINT FRACTURE °AC joint fracture means that there has been a break in the bones of the AC joint, usually the end of the clavicle. °TREATMENT °TREATMENT OF AC ARTHRITIS °· There is currently no way to replace the cartilage damaged by arthritis. The best way to improve the condition is to decrease the activities which aggravate the problem. Application of ice to the joint helps decrease pain and soreness (inflammation). The use of non-steroidal anti-inflammatory medication is helpful. °· If less conservative measures do not  work, then cortisone shots (injections) may be used. These are anti-inflammatories; they decrease the soreness in the joint and swelling. °· If non-surgical measures fail, surgery may be recommended. The procedure is generally removal of a portion of the end of the clavicle. This is the part of the collarbone closest to your acromion which is stabilized with ligaments to the acromion of the shoulder blade. This surgery may be performed using a tube-like instrument with a light (arthroscope) for looking into a joint. It may also be performed as an open surgery through a small incision by the surgeon. Most patients will have good range of motion within 6 weeks and may return to all activity including sports by 8-12 weeks, barring complications. °TREATMENT OF AN AC SEPARATION °· The initial treatment is to decrease pain. This is best accomplished by immobilizing the arm in a sling and placing an ice pack to the shoulder for 20 to 30 minutes every 2 hours as needed. As the pain starts to subside, it is important to begin moving the fingers, wrist, elbow and eventually the shoulder in order to prevent a stiff or "frozen" shoulder. Instruction on when and how much to move the shoulder will be provided by your caregiver. The length of time needed to regain full motion and function depends on the amount or grade of the injury. Recovery from a Grade I AC separation usually takes 10 to 14 days, whereas a Grade III may take 6 to 8 weeks. °· Grade I and II separations usually do not require surgery. Even Grade III injuries usually allow return to full   I and II separations usually do not require surgery. Even Grade III injuries usually allow return to full activity with few restrictions. Treatment is also based on the activity demands of the injured shoulder. For example, a high level quarterback with an injured throwing arm will receive more aggressive treatment than someone with a desk job who rarely uses his/her arm for strenuous activities. In some cases, a painful lump may persist which could require a later surgery. Surgery  can be very successful, but the benefits must be weighed against the potential risks.  TREATMENT OF AN AC JOINT FRACTURE  Fracture treatment depends on the type of fracture. Sometimes a splint or sling may be all that is required. Other times surgery may be required for repair. This is more frequently the case when the ligaments supporting the clavicle are completely torn. Your caregiver will help you with these decisions and together you can decide what will be the best treatment.  HOME CARE INSTRUCTIONS    Apply ice to the injury for 15-20 minutes each hour while awake for 2 days. Put the ice in a plastic bag and place a towel between the bag of ice and skin.   If a sling has been applied, wear it constantly for as long as directed by your caregiver, even at night. The sling or splint can be removed for bathing or showering or as directed. Be sure to keep the shoulder in the same place as when the sling is on. Do not lift the arm.   If a figure-of-eight splint has been applied it should be tightened gently by another person every day. Tighten it enough to keep the shoulders held back. Allow enough room to place the index finger between the body and strap. Loosen the splint immediately if there is numbness or tingling in the hands.   Take over-the-counter or prescription medicines for pain, discomfort or fever as directed by your caregiver.   If you or your child has received a follow up appointment, it is very important to keep that appointment in order to avoid long term complications, chronic pain or disability.  SEEK MEDICAL CARE IF:    The pain is not relieved with medications.   There is increased swelling or discoloration that continues to get worse rather than better.   You or your child has been unable to follow up as instructed.   There is progressive numbness and tingling in the arm, forearm or hand.  SEEK IMMEDIATE MEDICAL CARE IF:    The arm is numb, cold or pale.   There is increasing pain  in the hand, forearm or fingers.  MAKE SURE YOU:    Understand these instructions.   Will watch your condition.   Will get help right away if you are not doing well or get worse.  Document Released: 05/25/2005 Document Revised: 11/07/2011 Document Reviewed: 11/17/2008  [employer] Patient Information 2014 [employer].

## 2014-03-13 ENCOUNTER — Emergency Department (HOSPITAL_BASED_OUTPATIENT_CLINIC_OR_DEPARTMENT_OTHER)
Admission: EM | Admit: 2014-03-13 | Discharge: 2014-03-13 | Disposition: A | Discharge status: Home / Self Care | Payer: BC Managed Care – PPO | Attending: Emergency Medicine | Admitting: Emergency Medicine

## 2014-03-13 ENCOUNTER — Encounter (HOSPITAL_BASED_OUTPATIENT_CLINIC_OR_DEPARTMENT_OTHER): Payer: Self-pay | Admitting: Emergency Medicine

## 2014-03-13 DIAGNOSIS — H579 Unspecified disorder of eye and adnexa: Secondary | ICD-10-CM | POA: Insufficient documentation

## 2014-03-13 DIAGNOSIS — T50905A Adverse effect of unspecified drugs, medicaments and biological substances, initial encounter: Secondary | ICD-10-CM

## 2014-03-13 DIAGNOSIS — K1370 Unspecified lesions of oral mucosa: Secondary | ICD-10-CM | POA: Insufficient documentation

## 2014-03-13 DIAGNOSIS — R0602 Shortness of breath: Secondary | ICD-10-CM | POA: Insufficient documentation

## 2014-03-13 DIAGNOSIS — T782XXA Anaphylactic shock, unspecified, initial encounter: Secondary | ICD-10-CM | POA: Insufficient documentation

## 2014-03-13 DIAGNOSIS — L500 Allergic urticaria: Secondary | ICD-10-CM | POA: Insufficient documentation

## 2014-03-13 DIAGNOSIS — T368X5A Adverse effect of other systemic antibiotics, initial encounter: Secondary | ICD-10-CM | POA: Insufficient documentation

## 2014-03-13 MED ORDER — EPINEPHRINE HCL 1 MG/ML IJ SOLN
0.3000 mg | Freq: Once | INTRAMUSCULAR | Status: AC
  Administered 2014-03-13: 0.3 mg via INTRAMUSCULAR

## 2014-03-13 MED ORDER — FAMOTIDINE 20 MG PO TABS
20.0000 mg | ORAL_TABLET | Freq: Once | ORAL | Status: AC
  Administered 2014-03-13: 20 mg via ORAL
  Filled 2014-03-13: qty 1

## 2014-03-13 MED ORDER — EPINEPHRINE 0.3 MG/0.3ML IJ SOAJ
0.3000 mg | Freq: Once | INTRAMUSCULAR | Status: DC
  Filled 2014-03-13: qty 0.6

## 2014-03-13 MED ORDER — DIPHENHYDRAMINE HCL 50 MG/ML IJ SOLN
25.0000 mg | Freq: Once | INTRAMUSCULAR | Status: AC
  Administered 2014-03-13: 25 mg via INTRAVENOUS
  Filled 2014-03-13: qty 1

## 2014-03-13 MED ORDER — EPINEPHRINE 0.3 MG/0.3ML IJ SOAJ: 0.3000 mg | Freq: Once | INTRAMUSCULAR | Status: DC

## 2014-03-13 MED ORDER — DEXAMETHASONE SODIUM PHOSPHATE 4 MG/ML IJ SOLN
INTRAMUSCULAR | Status: DC
Start: 2014-03-13 — End: 2014-03-13
  Filled 2014-03-13: qty 1

## 2014-03-13 MED ORDER — PREDNISONE 20 MG PO TABS: ORAL_TABLET | ORAL | Status: DC

## 2014-03-13 MED ORDER — EPINEPHRINE HCL 1 MG/ML IJ SOLN
INTRAMUSCULAR | Status: AC
  Administered 2014-03-13: 0.3 mg via INTRAMUSCULAR
  Filled 2014-03-13: qty 1

## 2014-03-13 MED ORDER — DIPHENHYDRAMINE HCL 50 MG/ML IJ SOLN
50.0000 mg | Freq: Once | INTRAMUSCULAR | Status: AC
  Administered 2014-03-13: 50 mg via INTRAVENOUS
  Filled 2014-03-13: qty 1

## 2014-03-13 MED ORDER — SODIUM CHLORIDE 0.9 % IV BOLUS (SEPSIS)
1000.0000 mL | Freq: Once | INTRAVENOUS | Status: AC
  Administered 2014-03-13: 1000 mL via INTRAVENOUS

## 2014-03-13 MED ORDER — DEXAMETHASONE SODIUM PHOSPHATE 4 MG/ML IJ SOLN
INTRAMUSCULAR | Status: AC
Start: 2014-03-13 — End: 2014-03-13
  Administered 2014-03-13: 10 mg via INTRAVENOUS
  Filled 2014-03-13: qty 3

## 2014-03-13 MED ORDER — DEXAMETHASONE SODIUM PHOSPHATE 10 MG/ML IJ SOLN: 10.0000 mg | Freq: Once | INTRAMUSCULAR | Status: AC

## 2014-03-13 NOTE — ED Notes (Signed)
Patient states she had redness above her right eye lid and was seen by her PCP yesterday.  Was started on Clindamycin and took her first dose at 1700 pm yesterday, and a second dose around midnight.  Woke up this morning with generalized hives, sob and itching.

## 2014-03-13 NOTE — Discharge Instructions (Signed)
If you were given medicines take as directed.  If you are on coumadin or contraceptives realize their levels and effectiveness is altered by many different medicines.  If you have any reaction (rash, tongues swelling, other) to the medicines stop taking and see a physician.   Please follow up as directed and return to the ER or see a physician for new or worsening symptoms.  Thank you. Filed Vitals:   03/13/14 0737 03/13/14 0933  BP: 135/75   Pulse: 74 61  Temp: 99.1 F (37.3 C)   TempSrc: Oral   Resp:  16  Height: _0  (1.727 m)   Weight: 320 lb (145.151 kg)   SpO2: 99% 100%    Anaphylactic Reaction An anaphylactic reaction is a sudden, severe allergic reaction that involves the whole body. It can be life threatening. A hospital stay is often required. People with asthma, eczema, or hay fever are slightly more likely to have an anaphylactic reaction. CAUSES  An anaphylactic reaction may be caused by anything to which you are allergic. After being exposed to the allergic substance, your immune system becomes sensitized to it. When you are exposed to that allergic substance again, an allergic reaction can occur. Common causes of an anaphylactic reaction include:  Medicines.  Foods, especially peanuts, wheat, shellfish, milk, and eggs.  Insect bites or stings.  Blood products.  Chemicals, such as dyes, latex, and contrast material used for imaging tests. SYMPTOMS  When an allergic reaction occurs, the body releases histamine and other substances. These substances cause symptoms such as tightening of the airway. Symptoms often develop within seconds or minutes of exposure. Symptoms may include:  Skin rash or hives.  Itching.  Chest tightness.  Swelling of the eyes, tongue, or lips.  Trouble breathing or swallowing.  Lightheadedness or fainting.  Anxiety or confusion.  Stomach pains, vomiting, or diarrhea.  Nasal congestion.  A fast or irregular heartbeat  (palpitations). DIAGNOSIS  Diagnosis is based on your history of recent exposure to allergic substances, your symptoms, and a physical exam. Your caregiver may also perform blood or urine tests to confirm the diagnosis. TREATMENT  Epinephrine medicine is the main treatment for an anaphylactic reaction. Other medicines that may be used for treatment include antihistamines, steroids, and albuterol. In severe cases, fluids and medicine to support blood pressure may be given through an intravenous line (IV). Even if you improve after treatment, you need to be observed to make sure your condition does not get worse. This may require a stay in the hospital. [place] a medical alert bracelet or necklace stating your allergy.  You and your family must learn how to use an anaphylaxis kit or give an epinephrine injection to temporarily treat an emergency allergic reaction. Always carry your epinephrine injection or anaphylaxis kit with you. This can be lifesaving if you have a severe reaction.  Do not drive or perform tasks after treatment until the medicines used to treat your reaction have worn off, or until your caregiver says it is okay.  If you have hives or a rash:  Take medicines as directed by your caregiver.  You may use an over-the-counter antihistamine (diphenhydramine) as needed.  Apply cold compresses to the skin or take baths in cool water. Avoid hot baths or showers. SEEK MEDICAL CARE IF:   You develop symptoms of an allergic reaction to a new substance. Symptoms may start right away or minutes later.  You develop a rash, hives, or itching.  You develop new symptoms. SEEK IMMEDIATE MEDICAL CARE IF:   You have swelling of the mouth, difficulty breathing, or wheezing.  You have a tight feeling in your chest or throat.  You develop hives, swelling, or itching all over your body.  You develop severe vomiting or diarrhea.  You feel faint or pass out. This is  an emergency. Use your epinephrine injection or anaphylaxis kit as you have been instructed. Call your local emergency services (911 in [place] Even if you improve after the injection, you need to be examined at a hospital emergency department. MAKE SURE YOU:   Understand these instructions.  Will watch your condition.  Will get help right away if you are not doing well or get worse. Document Released: 08/15/2005 Document Revised: 08/20/2013 Document Reviewed: 11/16/2011 [facility] Patient Information 2015 [place], [place]. This information is not intended to replace advice given to you by your health care provider. Make sure you discuss any questions you have with your health care provider.

## 2014-03-13 NOTE — ED Provider Notes (Signed)
CSN: [id number]     Arrival date & time 03/13/14  9030 History   First MD Initiated Contact with Patient 03/13/14 [phone]     Chief Complaint  Patient presents with  . Allergic Reaction     (Consider location/radiation/quality/duration/timing/severity/associated sxs/prior Treatment) Patient is a 38 y.o. female presenting with allergic reaction.  Allergic Reaction Presenting symptoms: itching, rash and swelling   Presenting symptoms: no difficulty breathing, no difficulty swallowing and no wheezing   Itching:    Location:  Full body Rash:    Location:  Neck and chest   Severity:  Mild   Onset quality:  Sudden   Progression:  Unchanged Severity:  Moderate Prior allergic episodes:  Allergies to medications Context: medications (clindamycin)   Relieved by:  None tried Worsened by:  Nothing tried Ineffective treatments:  None tried   History reviewed. No pertinent past medical history. Past Surgical History  Procedure Laterality Date  . Breast reduction surgery    . Salpingectomy  2004    Scar tissue???  . Cesarean section  08/16/2011    Procedure: CESAREAN SECTION;  Surgeon: Darlyn Chamber;  Location: [facility];  Service: Gynecology;  Laterality: [name];  . Wisdom tooth extraction     Family History  Problem Relation Age of Onset  . Diabetes Mother   . Hypertension Mother   . Diabetes Father   . Stroke Father   . Hypertension Father   . Hypertension Sister   . Learning disabilities Cousin     autism  . Anesthesia problems Neg Hx   . Hypotension Neg Hx   . Malignant hyperthermia Neg Hx   . Pseudochol deficiency Neg Hx    History  Substance Use Topics  . Smoking status: Never Smoker   . Smokeless tobacco: Never Used  . Alcohol Use: No   OB History   Grav Para Term Preterm Abortions TAB SAB Ect Mult Living   1 1 1  0 0 0 0 0 0 1     Review of Systems  HENT: Negative for trouble swallowing.   Respiratory: Negative for wheezing.   Skin: Positive for itching and rash.   All other systems reviewed and are negative.     Allergies  Vioxx and Clindamycin/lincomycin  Home Medications   Prior to Admission medications   Medication Sig Start Date End Date Taking? Authorizing Provider  ibuprofen (ADVIL,MOTRIN) 200 MG tablet Take 200 mg by mouth every 6 (six) hours as needed.   Yes Historical Provider, MD  cyclobenzaprine (FLEXERIL) 10 MG tablet Take 1 tablet (10 mg total) by mouth 2 (two) times daily as needed for muscle spasms. 10/28/13   Blanchie Dessert, MD  EPINEPHrine (EPIPEN) 0.3 mg/0.3 mL IJ SOAJ injection Inject 0.3 mLs (0.3 mg total) into the muscle once. 03/13/14   Cordelia Poche, MD  ibuprofen (ADVIL,MOTRIN) 800 MG tablet Take 1 tablet (800 mg total) by mouth 3 (three) times daily. 10/28/13   Blanchie Dessert, MD  predniSONE (DELTASONE) 20 MG tablet 2 tabs po daily x 2 days 03/14/14   Cordelia Poche, MD   BP 152/74  Pulse 87  Temp(Src) 99.1 F (37.3 C) (Oral)  Resp 18  Ht 5\' 8"  (1.727 m)  Wt 320 lb (145.151 kg)  BMI 48.67 kg/m2  SpO2 100%  LMP 03/10/2014  Physical Exam  Vitals reviewed. Constitutional: She is oriented to person, place, and time. She appears well-developed and well-nourished.  HENT:  Mouth/Throat: Uvula is midline, oropharynx is clear and moist and mucous membranes are  normal. Uvula swelling present. No posterior oropharyngeal edema or posterior oropharyngeal erythema.  Eyes: Conjunctivae and EOM are normal.  Neck: Normal range of motion. Neck supple.  Pulmonary/Chest: Effort normal and breath sounds normal. No respiratory distress. She has no wheezes.  Lymphadenopathy:    She has no cervical adenopathy.  Neurological: She is alert and oriented to person, place, and time.  Skin: Skin is warm, dry and intact. Rash noted. Rash is papular (on upper chest and neck). Rash is not urticarial.  Whole face is edematous, with involvement of eyelids and lips.    ED Course  Procedures (including critical care time) Medications   dexamethasone (DECADRON) injection 10 mg (0 mg Intravenous Duplicate 1/91/47 8295)  diphenhydrAMINE (BENADRYL) injection 25 mg (25 mg Intravenous Given 03/13/14 0923)  sodium chloride 0.9 % bolus 1,000 mL (0 mLs Intravenous Stopped 03/13/14 1415)  dexamethasone (DECADRON) 4 MG/ML injection (10 mg Intravenous Given 03/13/14 0921)  EPINEPHrine (ADRENALIN) injection 0.3 mg (0.3 mg Intramuscular Given 03/13/14 0924)  famotidine (PEPCID) tablet 20 mg (20 mg Oral Given 03/13/14 1304)  diphenhydrAMINE (BENADRYL) injection 50 mg (50 mg Intravenous Given 03/13/14 1304)   Labs Review Labs Reviewed - No data to display  Imaging Review No results found.   EKG Interpretation None      MDM   Final diagnoses:  Anaphylaxis, initial encounter  Drug reaction, initial encounter   Patient with anaphylaxis. Rash, swelling and oral mucosal swelling present. Patient's breathing mildly compromised. Vitals remained stable with normotensive blood pressure. Patient improved with epinephrine, steroids, benadryl and Pepcid. Patient watched for many hours before feeling comfortable sending home. Patient to follow-up with PCP regarding antibiotic treatment. Informed patient to properly dispose of Clindamycin. Sent home with an Epi pen and short prednisone burst. Patient understood and agreed with plan. Patient stable for discharge home.    Cordelia Poche, MD 03/13/14 [phone]

## 2014-03-14 NOTE — ED Provider Notes (Signed)
Medical screening examination/treatment/procedure(s) were conducted as a shared visit with non-physician practitioner(s) or resident and myself. I personally evaluated the patient during the encounter and agree with the findings.  I have personally reviewed any xrays and/ or EKG's with the provider and I agree with interpretation.  CRITICAL CARE Performed by: Mariea Clonts  Total critical care time: 30 min  Critical care time was exclusive of separately billable procedures and treating other patients.  Critical care was necessary to treat or prevent imminent or life-threatening deterioration.  Critical care was time spent personally by me on the following activities: development of treatment plan with patient and/or surrogate as well as nursing, discussions with consultants, evaluation of patient's response to treatment, examination of patient, obtaining history from patient or surrogate, ordering and performing treatments and interventions, ordering and review of laboratory studies, ordering and review of radiographic studies, pulse oximetry and re-evaluation of patient's condition.  Patient with history of allergic reaction to clindamycin was placed on clindamycin and woke up this morning with diffuse facial swelling and mild breathing difficulty/sensation of swelling in the back of her throat. Patient hives and generalized itching. On exam patient has mild posterior pharyngeal edema, no stridor, no wheeze, no trismus, subtle lip swelling upper and lower with mild facial swelling per patient compared to normal. Patient feels the voices subtle change. I discussed this is likely anaphylaxis and patient has no cardiac history EpiPen given in ER and plan for observation for 3 hours. IV fluid bolus and Decadron with Benadryl given. On DC pt significantly improved.  Anaphylaxis, medicine allergic reaction   Mariea Clonts, MD 03/14/14 [phone]

## 2014-04-08 ENCOUNTER — Other Ambulatory Visit: Payer: Self-pay | Admitting: Obstetrics and Gynecology

## 2014-04-09 LAB — CYTOLOGY - PAP

## 2014-06-30 ENCOUNTER — Encounter (HOSPITAL_BASED_OUTPATIENT_CLINIC_OR_DEPARTMENT_OTHER): Payer: Self-pay | Admitting: Emergency Medicine

## 2014-09-11 ENCOUNTER — Ambulatory Visit (HOSPITAL_COMMUNITY)
Admission: RE | Admit: 2014-09-11 | Payer: BC Managed Care – PPO | Source: Ambulatory Visit | Admitting: Obstetrics and Gynecology

## 2014-09-11 ENCOUNTER — Encounter (HOSPITAL_COMMUNITY): Admission: RE | Payer: Self-pay | Source: Ambulatory Visit

## 2014-09-11 SURGERY — HYSTERECTOMY, VAGINAL, LAPAROSCOPY-ASSISTED
Anesthesia: General | Laterality: Right

## 2015-01-06 ENCOUNTER — Emergency Department (HOSPITAL_COMMUNITY): Payer: Medicaid Other

## 2015-01-06 ENCOUNTER — Encounter (HOSPITAL_COMMUNITY): Payer: Self-pay

## 2015-01-06 ENCOUNTER — Emergency Department (HOSPITAL_COMMUNITY)
Admission: EM | Admit: 2015-01-06 | Discharge: 2015-01-06 | Disposition: A | Discharge status: Home / Self Care | Payer: Medicaid Other | Attending: Emergency Medicine | Admitting: Emergency Medicine

## 2015-01-06 DIAGNOSIS — X58XXXA Exposure to other specified factors, initial encounter: Secondary | ICD-10-CM | POA: Insufficient documentation

## 2015-01-06 DIAGNOSIS — Y998 Other external cause status: Secondary | ICD-10-CM | POA: Insufficient documentation

## 2015-01-06 DIAGNOSIS — Y9289 Other specified places as the place of occurrence of the external cause: Secondary | ICD-10-CM | POA: Insufficient documentation

## 2015-01-06 DIAGNOSIS — Y9389 Activity, other specified: Secondary | ICD-10-CM | POA: Insufficient documentation

## 2015-01-06 DIAGNOSIS — S93402A Sprain of unspecified ligament of left ankle, initial encounter: Secondary | ICD-10-CM | POA: Insufficient documentation

## 2015-01-06 MED ORDER — NAPROXEN 375 MG PO TABS: 375.0000 mg | ORAL_TABLET | Freq: Two times a day (BID) | ORAL | Status: DC

## 2015-01-06 NOTE — Discharge Instructions (Signed)

## 2015-01-06 NOTE — ED Provider Notes (Signed)
CSN: [id number]     Arrival date & time 01/06/15  5784 History   First MD Initiated Contact with Patient 01/06/15 [phone]     Chief Complaint  Patient presents with  . Ankle Pain     (Consider location/radiation/quality/duration/timing/severity/associated sxs/prior Treatment) HPI    PCP: Eloise Levels, NP Blood pressure 133/70, pulse 81, temperature 98.5 F (36.9 C), temperature source Oral, resp. rate 16, last menstrual period 12/15/2014, SpO2 100 %.  Mary Ray is a 39 y.o.female without any significant PMH presents to the ER with complaints of left ankle pain for 1 week. She does not remember injuring the ankle. 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 She denies the pain is constant. It is elicited by certain movements and with certain steps. She has not had any redness or rash. It does not hurt to touch. Denies hx of gout, fevers. no other joints are painful at this time. The pain felt a bit worse this morning so she came in to be evaluated.    History reviewed. No pertinent past medical history. Past Surgical History  Procedure Laterality Date  . Breast reduction surgery    . Salpingectomy  2004    Scar tissue???  . Cesarean section  08/16/2011    Procedure: CESAREAN SECTION;  Surgeon: Darlyn Chamber;  Location: [facility];  Service: Gynecology;  Laterality: [name];  . Wisdom tooth extraction     Family History  Problem Relation Age of Onset  . Diabetes Mother   . Hypertension Mother   . Diabetes Father   . Stroke Father   . Hypertension Father   . Hypertension Sister   . Learning disabilities Cousin     autism  . Anesthesia problems Neg Hx   . Hypotension Neg Hx   . Malignant hyperthermia Neg Hx   . Pseudochol deficiency Neg Hx    History  Substance Use Topics  . Smoking status: Never Smoker   . Smokeless tobacco: Never Used  . Alcohol Use: No   OB History    Gravida Para Term Preterm AB TAB SAB Ectopic Multiple Living   1 1 1  0 0 0 0 0 0 1      Review of Systems  10 Systems reviewed and are negative for acute change except as noted in the HPI.     Allergies  Vioxx and Clindamycin/lincomycin  Home Medications   Prior to Admission medications   Medication Sig Start Date End Date Taking? Authorizing Provider  EPINEPHrine (EPIPEN) 0.3 mg/0.3 mL IJ SOAJ injection Inject 0.3 mLs (0.3 mg total) into the muscle once. 03/13/14  Yes Mariel Aloe, MD  ibuprofen (ADVIL,MOTRIN) 200 MG tablet Take 200 mg by mouth every 6 (six) hours as needed for headache or moderate pain.    Yes Historical Provider, MD  Multiple Vitamins-Minerals (MULTIVITAMIN WITH MINERALS) tablet Take 1 tablet by mouth daily.   Yes Historical Provider, MD  cyclobenzaprine (FLEXERIL) 10 MG tablet Take 1 tablet (10 mg total) by mouth 2 (two) times daily as needed for muscle spasms. Patient not taking: Reported on 01/06/2015 10/28/13   Blanchie Dessert, MD  ibuprofen (ADVIL,MOTRIN) 800 MG tablet Take 1 tablet (800 mg total) by mouth 3 (three) times daily. Patient not taking: Reported on 01/06/2015 10/28/13   Blanchie Dessert, MD  naproxen (NAPROSYN) 375 MG tablet Take 1 tablet (375 mg total) by mouth 2 (two) times daily. 01/06/15   Beyonca Wisz Carlota Raspberry, PA-C  predniSONE (DELTASONE) 20 MG tablet 2  tabs po daily x 2 days Patient not taking: Reported on 01/06/2015 03/14/14   Mariel Aloe, MD   BP 133/70 mmHg  Pulse 81  Temp(Src) 98.5 F (36.9 C) (Oral)  Resp 16  SpO2 100%  LMP 12/15/2014 Physical Exam  Constitutional: She appears well-developed and well-nourished. No distress.  HENT:  Head: Normocephalic and atraumatic.  Eyes: Pupils are equal, round, and reactive to light.  Neck: Normal range of motion. Neck supple.  Cardiovascular: Normal rate and regular rhythm.   Pulmonary/Chest: Effort normal.  Abdominal: Soft.  Musculoskeletal:       Left ankle: She exhibits decreased range of motion (due to pain with eversion and lateral and medial rotation) and swelling. She  exhibits no ecchymosis, no deformity, no laceration and normal pulse. Tenderness. Medial malleolus tenderness found. No lateral malleolus tenderness found. Achilles tendon normal.  Neurological: She is alert.  Skin: Skin is warm and dry.  Nursing note and vitals reviewed.   ED Course  Procedures (including critical care time) Labs Review Labs Reviewed - No data to display  Imaging Review Dg Ankle Complete Left  01/06/2015   CLINICAL DATA:  39 year old female stepped wrong way 1 week ago. Medial pain. Initial encounter.  EXAM: LEFT ANKLE COMPLETE - 3+ VIEW  COMPARISON:  None.  FINDINGS: No fracture or dislocation.  Soft tissue prominence medial malleolar region may be normal for this patient versus result of soft tissue injury.  Tiny plantar spur and tiny spur at the level of the Achilles tendon insertion site.  IMPRESSION: No fracture or dislocation.  Soft tissue prominence medial malleolar region may be normal for this patient versus result of soft tissue injury.   Electronically Signed   By: Genia Del M.D.   On: 01/06/2015 07:45     EKG Interpretation None      MDM   Final diagnoses:  Ankle sprain, left, initial encounter    Xray and physical exam consistent with ankle sprain. Recommend RICE/NSAIDs ASO splint given in ED. Pt declined crutches. Referral to Ortho given.  39 y.o.[name]'s evaluation in the Emergency Department is complete. It has been determined that no acute conditions requiring further emergency intervention are present at this time. The patient/guardian have been advised of the diagnosis and plan. We have discussed signs and symptoms that warrant return to the ED, such as changes or worsening in symptoms.  Vital signs are stable at discharge. Filed Vitals:   01/06/15 0723  BP: 133/70  Pulse: 81  Temp: 98.5 F (36.9 C)  Resp: 16    Patient/guardian has voiced understanding and agreed to follow-up with the PCP or specialist.     Delos Haring, PA-C 01/06/15 [place], DO 01/06/15 2409

## 2015-01-06 NOTE — ED Notes (Signed)
Pt here with left ankle pain.  Pain started a week ago and has gotten worse.  No injury noted.  Worse with flexion.

## 2015-06-09 ENCOUNTER — Other Ambulatory Visit (HOSPITAL_COMMUNITY): Payer: Self-pay | Admitting: Obstetrics and Gynecology

## 2015-06-09 NOTE — H&P (Signed)
[facility]  DICTATION # [id number] CSN# [id number]   Margarette Asal, MD 06/09/2015 4:00 PM

## 2015-06-15 NOTE — H&P (Signed)
NAMEALDINE, CHAKRABORTY              ACCOUNT NO.:  [id number]  MEDICAL RECORD NO.:  [id number]  LOCATION:                                FACILITY:  [facility]  PHYSICIAN:  Ralene Bathe. Matthew Saras, M.D.DATE OF BIRTH:  16-Nov-1975  DATE OF ADMISSION:  09/28/2015 DATE OF DISCHARGE:                             HISTORY & PHYSICAL   CHIEF COMPLAINT:  Menorrhagia, pelvic pain, dysmenorrhea, leiomyoma, possible adenomyosis.  HPI:  A 39 year old, G1, P1.  She has had 1 prior cesarean section. This patient has been bothered over the last several years with menorrhagia, did have Mirena IUD to try to control her bleeding without much improvement.  She has also not done well with OCPs in the past. Has variously been on Ferralet to improve her hemoglobin.  Last ultrasound a year ago, intramural fibroids 2.3, 1.9, 1.7, and 1.2 with some thickened myometrium suspicious for adenomyosis.  She presents at this time for LAVH with right salpingectomy.  She has had a prior left salpingectomy by Dr. Syble Creek, when she had laparoscopy years ago. This procedure including specific risks related to bleeding, infection, transfusion, adjacent organ injury, wound infection, phlebitis, the possible need to complete the surgery by open technique, all discussed.  PAST MEDICAL HISTORY:  Allergies:  None. Current Medications:  Iron.  PRIOR SURGICAL HISTORY:  Laparoscopic left salpingectomy by Dr. Syble Creek, breast reduction surgery, one cesarean section in 2012.  FAMILY HISTORY:  Significant for diabetes.  SOCIAL HISTORY:  Denies alcohol, tobacco, or drug use.  She is single.  PHYSICAL EXAMINATION:  VITAL SIGNS:  Temperature 98.2, blood pressure 130/78. HEENT:  Unremarkable. NECK:  Supple without masses. LUNGS:  Clear. CARDIOVASCULAR:  Regular rate and rhythm without murmurs, rubs, or gallops noted.  BREASTS:  Without masses. ABDOMEN:  Soft, flat, nontender.  Vulva, vagina, cervix normal.  Uterus mid position,  normal size.  Adnexa negative. EXTREMITIES:  Unremarkable. NEUROLOGIC:  Unremarkable.  IMPRESSION:  Menorrhagia with pelvic pain, symptomatic leiomyoma.  PLAN:  LAVH, bilateral salpingectomy, possible TAH bilateral salpingectomy.  Procedure and risks reviewed as above.     Goran Olden M. Matthew Saras, M.D.     RMH/MEDQ  D:  06/09/2015  T:  06/10/2015  Job:  [id number]

## 2015-10-02 NOTE — H&P (Signed)
Mary Ray, FONES              ACCOUNT NO.:  [id number]  MEDICAL RECORD NO.:  [id number]  LOCATION:                                 FACILITY:  PHYSICIAN:  Ralene Bathe. Matthew Saras, M.D.    DATE OF BIRTH:  DATE OF ADMISSION:  10/19/2015 DATE OF DISCHARGE:                             HISTORY & PHYSICAL   CHIEF COMPLAINT:  Menorrhagia secondary to leiomyoma.  HPI:  A 40 year old, G1, P1, 1 delivery by cesarean section in 2012. Prior to that, she had had laparoscopy by [place] B. Rosana Hoes, M.D.  Anatomy was normal except for a left hydrosalpinx which was removed at that time.  She had been seeing our nurse practitioner for the last several years with worsening problems related to menorrhagia, has failed to improve both with Mirena IUD and OCPs, presents at this time for definitive LAVH right salpingectomy, this procedure including specific risks related to bleeding, infection, transfusion, adjacent organ injury, other complications such as wound infection, phlebitis, the possible need to complete the surgery by open technique, all discussed with her which she understands and accepts.  Ultrasound dated September 15th, showed intramural fibroids, 2.3 that was calcified 1.9, 1.7, 1.2, possible adenomyosis noted.  PAST MEDICAL HISTORY:  Allergies:  CLINDAMYCIN. Current Medications:  None.  She has had a cesarean section, laparoscopy, and also a breast reduction.  SOCIAL HISTORY:  Denies alcohol, tobacco, or drug use.  PHYSICAL EXAMINATION:  VITAL SIGNS:  Temp 98.2, blood pressure 120/78. HEENT:  Unremarkable. NECK:  Supple without masses. LUNGS:  Clear. CARDIOVASCULAR:  Regular rate and rhythm without murmurs, rubs, gallops noted. BREASTS:  Negative.  She has had a reduction mammoplasty. ABDOMEN:  Soft, flat, nontender.  A well-healed C-section scar.  Vulva, vagina, cervix normal.  Uterus upper limit of normal size.  Adnexa unremarkable.  Last Pap in November 2016 was  normal.  IMPRESSION:  Leiomyoma, menorrhagia, unresponsive to conservative measures.  PLAN:  LAVH, right salpingectomy.  Procedure and risks reviewed as above.  Note, current weight is 335.     Jordin Dambrosio M. Matthew Saras, M.D.     RMH/MEDQ  D:  10/02/2015  T:  10/02/2015  Job:  [id number]

## 2015-10-07 ENCOUNTER — Encounter (HOSPITAL_COMMUNITY): Payer: Self-pay

## 2015-10-07 ENCOUNTER — Encounter (HOSPITAL_COMMUNITY)
Admission: RE | Admit: 2015-10-07 | Discharge: 2015-10-07 | Disposition: A | Discharge status: Home / Self Care | Payer: BLUE CROSS/BLUE SHIELD | Source: Ambulatory Visit | Attending: Obstetrics and Gynecology | Admitting: Obstetrics and Gynecology

## 2015-10-07 DIAGNOSIS — Z01812 Encounter for preprocedural laboratory examination: Secondary | ICD-10-CM | POA: Insufficient documentation

## 2015-10-07 LAB — CBC
HCT: 34.4 % — ABNORMAL LOW (ref 36.0–46.0)
HEMOGLOBIN: 11.4 g/dL — AB (ref 12.0–15.0)
MCH: 27.8 pg (ref 26.0–34.0)
MCHC: 33.1 g/dL (ref 30.0–36.0)
MCV: 83.9 fL (ref 78.0–100.0)
PLATELETS: 413 10*3/uL — AB (ref 150–400)
RBC: 4.1 MIL/uL (ref 3.87–5.11)
RDW: 14 % (ref 11.5–15.5)
WBC: 7.6 10*3/uL (ref 4.0–10.5)

## 2015-10-07 LAB — TYPE AND SCREEN
ABO/RH(D): A POS
ANTIBODY SCREEN: NEGATIVE

## 2015-10-07 LAB — ABO/RH: ABO/RH(D): A POS

## 2015-10-07 NOTE — Patient Instructions (Signed)
Your procedure is scheduled on: October 19, 2015  Enter through the Main Entrance of [facility] at: 6:00 am   Pick up the phone at the desk and dial [phone].  Call this number if you have problems the morning of surgery: [phone].  Remember: Do NOT eat food: after midnight on Sunday  Do NOT drink clear liquids after: midnight on Sunday  Take these medicines the morning of surgery with a SIP OF WATER: none   Do NOT wear jewelry (body piercing), metal hair clips/bobby pins, or nail polish. Do NOT wear lotions, powders, or perfumes.  You may wear deoderant. Do NOT shave for 48 hours prior to surgery. Do NOT bring valuables to the hospital. Contacts, dentures, or bridgework may not be worn into surgery. Leave suitcase in car.  After surgery it may be brought to your room.  For patients admitted to the hospital, checkout time is 11:00 AM the day of discharge.

## 2015-10-18 MED ORDER — DEXTROSE 5 % IV SOLN
2.0000 g | INTRAVENOUS | Status: AC
  Administered 2015-10-19: 2 g via INTRAVENOUS
  Filled 2015-10-18: qty 2

## 2015-10-19 ENCOUNTER — Inpatient Hospital Stay (HOSPITAL_COMMUNITY)
Admission: AD | Admit: 2015-10-19 | Discharge: 2015-10-21 | DRG: 742 | Disposition: A | Discharge status: Home / Self Care | Payer: BLUE CROSS/BLUE SHIELD | Source: Ambulatory Visit | Attending: Obstetrics and Gynecology | Admitting: Obstetrics and Gynecology

## 2015-10-19 ENCOUNTER — Ambulatory Visit (HOSPITAL_COMMUNITY): Payer: BLUE CROSS/BLUE SHIELD | Admitting: Certified Registered Nurse Anesthetist

## 2015-10-19 ENCOUNTER — Encounter (HOSPITAL_COMMUNITY): Payer: Self-pay | Admitting: *Deleted

## 2015-10-19 ENCOUNTER — Encounter (HOSPITAL_COMMUNITY)
Admission: AD | Disposition: A | Discharge status: Home / Self Care | Payer: Self-pay | Source: Ambulatory Visit | Attending: Obstetrics and Gynecology

## 2015-10-19 DIAGNOSIS — Z6841 Body Mass Index (BMI) 40.0 and over, adult: Secondary | ICD-10-CM | POA: Diagnosis not present

## 2015-10-19 DIAGNOSIS — D259 Leiomyoma of uterus, unspecified: Principal | ICD-10-CM | POA: Diagnosis present

## 2015-10-19 DIAGNOSIS — N920 Excessive and frequent menstruation with regular cycle: Secondary | ICD-10-CM | POA: Diagnosis present

## 2015-10-19 HISTORY — PX: ABDOMINAL HYSTERECTOMY: SHX81

## 2015-10-19 LAB — PREGNANCY, URINE: Preg Test, Ur: NEGATIVE

## 2015-10-19 SURGERY — HYSTERECTOMY, ABDOMINAL
Anesthesia: General | Site: Abdomen | Laterality: Right

## 2015-10-19 MED ORDER — NEOSTIGMINE METHYLSULFATE 10 MG/10ML IV SOLN
INTRAVENOUS | Status: DC | PRN
  Administered 2015-10-19: 5 mg via INTRAVENOUS

## 2015-10-19 MED ORDER — MIDAZOLAM HCL 2 MG/2ML IJ SOLN
INTRAMUSCULAR | Status: DC | PRN
  Administered 2015-10-19 (×2): 1 mg via INTRAVENOUS

## 2015-10-19 MED ORDER — ONDANSETRON HCL 4 MG/2ML IJ SOLN: 4.0000 mg | Freq: Four times a day (QID) | INTRAMUSCULAR | Status: DC | PRN

## 2015-10-19 MED ORDER — SODIUM CHLORIDE 0.9 % IJ SOLN
INTRAMUSCULAR | Status: AC
  Filled 2015-10-19: qty 10

## 2015-10-19 MED ORDER — HYDROMORPHONE HCL 1 MG/ML IJ SOLN
INTRAMUSCULAR | Status: AC
  Filled 2015-10-19: qty 2

## 2015-10-19 MED ORDER — ROCURONIUM BROMIDE 100 MG/10ML IV SOLN
INTRAVENOUS | Status: DC | PRN
  Administered 2015-10-19: 60 mg via INTRAVENOUS

## 2015-10-19 MED ORDER — MORPHINE SULFATE 2 MG/ML IV SOLN
INTRAVENOUS | Status: DC
  Administered 2015-10-19: 11:00:00 via INTRAVENOUS
  Filled 2015-10-19: qty 25

## 2015-10-19 MED ORDER — SODIUM CHLORIDE 0.9% FLUSH: 9.0000 mL | INTRAVENOUS | Status: DC | PRN

## 2015-10-19 MED ORDER — ONDANSETRON HCL 4 MG/2ML IJ SOLN
INTRAMUSCULAR | Status: AC
  Filled 2015-10-19: qty 2

## 2015-10-19 MED ORDER — DIPHENHYDRAMINE HCL 12.5 MG/5ML PO ELIX: 12.5000 mg | ORAL_SOLUTION | Freq: Four times a day (QID) | ORAL | Status: DC | PRN

## 2015-10-19 MED ORDER — MENTHOL 3 MG MT LOZG: 1.0000 | LOZENGE | OROMUCOSAL | Status: DC | PRN

## 2015-10-19 MED ORDER — DEXAMETHASONE SODIUM PHOSPHATE 10 MG/ML IJ SOLN
INTRAMUSCULAR | Status: AC
  Filled 2015-10-19: qty 1

## 2015-10-19 MED ORDER — DEXAMETHASONE SODIUM PHOSPHATE 10 MG/ML IJ SOLN
INTRAMUSCULAR | Status: DC | PRN
  Administered 2015-10-19: 10 mg via INTRAVENOUS

## 2015-10-19 MED ORDER — SCOPOLAMINE 1 MG/3DAYS TD PT72
MEDICATED_PATCH | TRANSDERMAL | Status: AC
  Administered 2015-10-19: 1.5 mg via TRANSDERMAL
  Filled 2015-10-19: qty 1

## 2015-10-19 MED ORDER — SODIUM CHLORIDE 0.9 % IJ SOLN: INTRAMUSCULAR | Status: DC | PRN

## 2015-10-19 MED ORDER — HYDROMORPHONE HCL 1 MG/ML IJ SOLN
INTRAMUSCULAR | Status: DC | PRN
  Administered 2015-10-19: 2 mg via INTRAVENOUS

## 2015-10-19 MED ORDER — PROMETHAZINE HCL 25 MG/ML IJ SOLN: 6.2500 mg | INTRAMUSCULAR | Status: DC | PRN

## 2015-10-19 MED ORDER — DIPHENHYDRAMINE HCL 50 MG/ML IJ SOLN: 12.5000 mg | Freq: Four times a day (QID) | INTRAMUSCULAR | Status: DC | PRN

## 2015-10-19 MED ORDER — MIDAZOLAM HCL 2 MG/2ML IJ SOLN
INTRAMUSCULAR | Status: AC
  Filled 2015-10-19: qty 2

## 2015-10-19 MED ORDER — SCOPOLAMINE 1 MG/3DAYS TD PT72
1.0000 | MEDICATED_PATCH | Freq: Once | TRANSDERMAL | Status: DC
  Administered 2015-10-19: 1.5 mg via TRANSDERMAL

## 2015-10-19 MED ORDER — ONDANSETRON HCL 4 MG/2ML IJ SOLN
INTRAMUSCULAR | Status: DC | PRN
  Administered 2015-10-19 (×2): 2 mg via INTRAVENOUS

## 2015-10-19 MED ORDER — MORPHINE SULFATE 2 MG/ML IV SOLN
INTRAVENOUS | Status: DC
  Administered 2015-10-19: 19 mg via INTRAVENOUS

## 2015-10-19 MED ORDER — KETOROLAC TROMETHAMINE 30 MG/ML IJ SOLN
INTRAMUSCULAR | Status: AC
  Filled 2015-10-19: qty 1

## 2015-10-19 MED ORDER — FENTANYL CITRATE (PF) 250 MCG/5ML IJ SOLN
INTRAMUSCULAR | Status: AC
  Filled 2015-10-19: qty 5

## 2015-10-19 MED ORDER — HYDROMORPHONE HCL 1 MG/ML IJ SOLN
0.2500 mg | INTRAMUSCULAR | Status: DC | PRN
  Administered 2015-10-19: 0.5 mg via INTRAVENOUS
  Administered 2015-10-19 (×2): 0.25 mg via INTRAVENOUS

## 2015-10-19 MED ORDER — GLYCOPYRROLATE 0.2 MG/ML IJ SOLN
INTRAMUSCULAR | Status: AC
  Filled 2015-10-19: qty 3

## 2015-10-19 MED ORDER — LIDOCAINE HCL (CARDIAC) 20 MG/ML IV SOLN
INTRAVENOUS | Status: DC | PRN
  Administered 2015-10-19: 20 mg via INTRAVENOUS

## 2015-10-19 MED ORDER — BUPIVACAINE LIPOSOME 1.3 % IJ SUSP
20.0000 mL | Freq: Once | INTRAMUSCULAR | Status: AC
  Administered 2015-10-19: 20 mL
  Filled 2015-10-19 (×2): qty 20

## 2015-10-19 MED ORDER — METHYLENE BLUE 1 % INJ SOLN
INTRAMUSCULAR | Status: AC
  Filled 2015-10-19: qty 1

## 2015-10-19 MED ORDER — OXYCODONE-ACETAMINOPHEN 5-325 MG PO TABS
1.0000 | ORAL_TABLET | ORAL | Status: DC | PRN
  Administered 2015-10-19: 2 via ORAL
  Filled 2015-10-19: qty 2

## 2015-10-19 MED ORDER — GLYCOPYRROLATE 0.2 MG/ML IJ SOLN
INTRAMUSCULAR | Status: DC | PRN
  Administered 2015-10-19: .6 mg via INTRAVENOUS
  Administered 2015-10-19 (×2): 0.1 mg via INTRAVENOUS

## 2015-10-19 MED ORDER — NALOXONE HCL 0.4 MG/ML IJ SOLN: 0.4000 mg | INTRAMUSCULAR | Status: DC | PRN

## 2015-10-19 MED ORDER — BUTORPHANOL TARTRATE 1 MG/ML IJ SOLN
1.0000 mg | INTRAMUSCULAR | Status: DC | PRN
  Administered 2015-10-19 – 2015-10-20 (×4): 2 mg via INTRAVENOUS
  Filled 2015-10-19 (×5): qty 2

## 2015-10-19 MED ORDER — LACTATED RINGERS IV SOLN
INTRAVENOUS | Status: DC
  Administered 2015-10-19 (×3): via INTRAVENOUS

## 2015-10-19 MED ORDER — DEXTROSE IN LACTATED RINGERS 5 % IV SOLN
INTRAVENOUS | Status: DC
  Administered 2015-10-19 (×2): via INTRAVENOUS

## 2015-10-19 MED ORDER — FENTANYL CITRATE (PF) 100 MCG/2ML IJ SOLN
INTRAMUSCULAR | Status: DC | PRN
  Administered 2015-10-19 (×2): 50 ug via INTRAVENOUS
  Administered 2015-10-19: 200 ug via INTRAVENOUS
  Administered 2015-10-19 (×2): 50 ug via INTRAVENOUS
  Administered 2015-10-19: 100 ug via INTRAVENOUS

## 2015-10-19 MED ORDER — BUPIVACAINE HCL (PF) 0.25 % IJ SOLN
INTRAMUSCULAR | Status: AC
  Filled 2015-10-19: qty 30

## 2015-10-19 MED ORDER — PROPOFOL 10 MG/ML IV BOLUS
INTRAVENOUS | Status: DC | PRN
  Administered 2015-10-19: 200 mg via INTRAVENOUS

## 2015-10-19 MED ORDER — HYDROMORPHONE HCL 1 MG/ML IJ SOLN
INTRAMUSCULAR | Status: AC
  Administered 2015-10-19: 0.25 mg via INTRAVENOUS
  Filled 2015-10-19: qty 1

## 2015-10-19 MED ORDER — NEOSTIGMINE METHYLSULFATE 10 MG/10ML IV SOLN
INTRAVENOUS | Status: AC
  Filled 2015-10-19: qty 1

## 2015-10-19 MED ORDER — PROPOFOL 10 MG/ML IV BOLUS
INTRAVENOUS | Status: AC
  Filled 2015-10-19: qty 20

## 2015-10-19 MED ORDER — 0.9 % SODIUM CHLORIDE (POUR BTL) OPTIME
TOPICAL | Status: DC | PRN
  Administered 2015-10-19: 2000 mL

## 2015-10-19 MED ORDER — LIDOCAINE HCL (CARDIAC) 20 MG/ML IV SOLN
INTRAVENOUS | Status: AC
  Filled 2015-10-19: qty 5

## 2015-10-19 MED ORDER — MIDAZOLAM HCL 2 MG/2ML IJ SOLN: 0.5000 mg | Freq: Once | INTRAMUSCULAR | Status: DC | PRN

## 2015-10-19 MED ORDER — ONDANSETRON HCL 4 MG PO TABS: 4.0000 mg | ORAL_TABLET | Freq: Four times a day (QID) | ORAL | Status: DC | PRN

## 2015-10-19 MED ORDER — SODIUM CHLORIDE 0.9 % IJ SOLN
INTRAMUSCULAR | Status: AC
  Filled 2015-10-19: qty 100

## 2015-10-19 MED ORDER — ROCURONIUM BROMIDE 100 MG/10ML IV SOLN
INTRAVENOUS | Status: AC
  Filled 2015-10-19: qty 1

## 2015-10-19 MED ORDER — MEPERIDINE HCL 25 MG/ML IJ SOLN: 6.2500 mg | INTRAMUSCULAR | Status: DC | PRN

## 2015-10-19 SURGICAL SUPPLY — 69 items
APL SKNCLS STERI-STRIP NONHPOA (GAUZE/BANDAGES/DRESSINGS) ×1
BARRIER ADHS 3X4 INTERCEED (GAUZE/BANDAGES/DRESSINGS) IMPLANT
BENZOIN TINCTURE PRP APPL 2/3 (GAUZE/BANDAGES/DRESSINGS) ×3 IMPLANT
BRR ADH 4X3 ABS CNTRL BYND (GAUZE/BANDAGES/DRESSINGS)
CABLE HIGH FREQUENCY MONO STRZ (ELECTRODE) IMPLANT
CANISTER SUCT 3000ML (MISCELLANEOUS) ×3 IMPLANT
CATH ROBINSON RED A/P 16FR (CATHETERS) ×3 IMPLANT
CLOTH BEACON ORANGE TIMEOUT ST (SAFETY) ×3 IMPLANT
CONT PATH 16OZ SNAP LID 3702 (MISCELLANEOUS) ×3 IMPLANT
COVER BACK TABLE 60X90IN (DRAPES) ×3 IMPLANT
DECANTER SPIKE VIAL GLASS SM (MISCELLANEOUS) ×3 IMPLANT
DRAPE CESAREAN BIRTH W POUCH (DRAPES) ×3 IMPLANT
DRAPE WARM FLUID 44X44 (DRAPE) ×3 IMPLANT
DRSG COVADERM PLUS 2X2 (GAUZE/BANDAGES/DRESSINGS) ×6 IMPLANT
DRSG OPSITE POSTOP 3X4 (GAUZE/BANDAGES/DRESSINGS) IMPLANT
DRSG OPSITE POSTOP 4X10 (GAUZE/BANDAGES/DRESSINGS) ×3 IMPLANT
DURAPREP 26ML APPLICATOR (WOUND CARE) ×3 IMPLANT
ELECT LIGASURE SHORT 9 REUSE (ELECTRODE) ×3 IMPLANT
ELECT REM PT RETURN 9FT ADLT (ELECTROSURGICAL) ×3
ELECTRODE REM PT RTRN 9FT ADLT (ELECTROSURGICAL) ×2 IMPLANT
GAUZE SPONGE 4X4 12PLY STRL (GAUZE/BANDAGES/DRESSINGS) ×3 IMPLANT
GAUZE SPONGE 4X4 16PLY XRAY LF (GAUZE/BANDAGES/DRESSINGS) IMPLANT
GLOVE BIO SURGEON STRL SZ7 (GLOVE) ×6 IMPLANT
GLOVE BIOGEL PI IND STRL 6.5 (GLOVE) ×2 IMPLANT
GLOVE BIOGEL PI IND STRL 7.0 (GLOVE) ×6 IMPLANT
GLOVE BIOGEL PI INDICATOR 6.5 (GLOVE) ×1
GLOVE BIOGEL PI INDICATOR 7.0 (GLOVE) ×3
GOWN STRL REUS W/TWL LRG LVL3 (GOWN DISPOSABLE) ×9 IMPLANT
LEGGING LITHOTOMY PAIR STRL (DRAPES) IMPLANT
LIQUID BAND (GAUZE/BANDAGES/DRESSINGS) ×3 IMPLANT
NEEDLE HYPO 18GX1.5 BLUNT FILL (NEEDLE) IMPLANT
NEEDLE HYPO 22GX1.5 SAFETY (NEEDLE) ×6 IMPLANT
NEEDLE INSUFFLATION 120MM (ENDOMECHANICALS) ×3 IMPLANT
NS IRRIG 1000ML POUR BTL (IV SOLUTION) IMPLANT
PACK ABDOMINAL GYN (CUSTOM PROCEDURE TRAY) ×3 IMPLANT
PACK LAVH (CUSTOM PROCEDURE TRAY) ×3 IMPLANT
PACK ROBOTIC GOWN (GOWN DISPOSABLE) ×3 IMPLANT
PAD ABD 7.5X8 STRL (GAUZE/BANDAGES/DRESSINGS) ×3 IMPLANT
PAD OB MATERNITY 4.3X12.25 (PERSONAL CARE ITEMS) ×12 IMPLANT
PAD TRENDELENBURG POSITION (MISCELLANEOUS) ×3 IMPLANT
PENCIL SMOKE EVAC W/HOLSTER (ELECTROSURGICAL) IMPLANT
SEALER TISSUE G2 CVD JAW 45CM (ENDOMECHANICALS) ×3 IMPLANT
SET IRRIG TUBING LAPAROSCOPIC (IRRIGATION / IRRIGATOR) IMPLANT
SLEEVE XCEL OPT CAN 5 100 (ENDOMECHANICALS) IMPLANT
SPONGE LAP 18X18 X RAY DECT (DISPOSABLE) ×9 IMPLANT
STRIP CLOSURE SKIN 1/2X4 (GAUZE/BANDAGES/DRESSINGS) ×3 IMPLANT
SUT CHROMIC 3 0 SH 27 (SUTURE) IMPLANT
SUT MON AB 2-0 CT1 36 (SUTURE) ×6 IMPLANT
SUT MON AB 4-0 PS1 27 (SUTURE) ×3 IMPLANT
SUT PDS AB 0 CT1 27 (SUTURE) ×6 IMPLANT
SUT PDS AB 0 CTX 60 (SUTURE) ×3 IMPLANT
SUT VIC AB 0 CT1 18XCR BRD8 (SUTURE) ×8 IMPLANT
SUT VIC AB 0 CT1 27 (SUTURE) ×3
SUT VIC AB 0 CT1 27XBRD ANBCTR (SUTURE) ×2 IMPLANT
SUT VIC AB 0 CT1 36 (SUTURE) ×3 IMPLANT
SUT VIC AB 0 CT1 8-18 (SUTURE) ×12
SUT VIC AB 2-0 CT1 27 (SUTURE)
SUT VIC AB 2-0 CT1 TAPERPNT 27 (SUTURE) IMPLANT
SUT VIC AB 3-0 CT1 27 (SUTURE) ×3
SUT VIC AB 3-0 CT1 TAPERPNT 27 (SUTURE) ×2 IMPLANT
SUT VICRYL 0 TIES 12 18 (SUTURE) ×3 IMPLANT
SUT VICRYL 4-0 PS2 18IN ABS (SUTURE) ×3 IMPLANT
SYR 20CC LL (SYRINGE) ×3 IMPLANT
TOWEL OR 17X24 6PK STRL BLUE (TOWEL DISPOSABLE) ×6 IMPLANT
TRAY FOLEY CATH SILVER 14FR (SET/KITS/TRAYS/PACK) ×3 IMPLANT
TROCAR OPTI TIP 5M 100M (ENDOMECHANICALS) IMPLANT
TROCAR XCEL DIL TIP R 11M (ENDOMECHANICALS) IMPLANT
WARMER LAPAROSCOPE (MISCELLANEOUS) ×3 IMPLANT
WATER STERILE IRR 1000ML POUR (IV SOLUTION) IMPLANT

## 2015-10-19 NOTE — Transfer of Care (Signed)
Immediate Anesthesia Transfer of Care Note  Patient: Mary Ray  Procedure(s) Performed: Procedure(s):  HYSTERECTOMY ABDOMINAL WITH RIGHT SALPINGECTOMYand left oophorectomy ([name])  Patient Location: PACU  Anesthesia Type:General  Level of Consciousness: awake, alert  and oriented  Airway & Oxygen Therapy: Patient Spontanous Breathing and Patient connected to face mask oxygen  Post-op Assessment: Report given to RN, Post -op Vital signs reviewed and stable and Patient moving all extremities X 4  Post vital signs: Reviewed and stable  Last Vitals:  Filed Vitals:   10/19/15 0559  BP: 128/89  Pulse: 71  Temp: 36.8 C  Resp: 20    Complications: No apparent anesthesia complications

## 2015-10-19 NOTE — Anesthesia Procedure Notes (Signed)
Procedure Name: Intubation Date/Time: 10/19/2015 7:30 AM Performed by: Mayer Camel, Cellie Dardis A Pre-anesthesia Checklist: Patient identified, Emergency Drugs available, Suction available and Patient being monitored Patient Re-evaluated:Patient Re-evaluated prior to inductionOxygen Delivery Method: Circle system utilized Preoxygenation: Pre-oxygenation with 100% oxygen Intubation Type: IV induction Ventilation: Mask ventilation without difficulty and Oral airway inserted - appropriate to patient size Laryngoscope Size: Miller and 2 Grade View: Grade I Tube type: Oral Number of attempts: 1 Placement Confirmation: ETT inserted through vocal cords under direct vision,  positive ETCO2 and breath sounds checked- equal and bilateral Secured at: 23 cm Tube secured with: Tape Dental Injury: Teeth and Oropharynx as per pre-operative assessment

## 2015-10-19 NOTE — Progress Notes (Signed)
The patient was re-examined with no change in status, discussed poss TAH as per office consent

## 2015-10-19 NOTE — Anesthesia Preprocedure Evaluation (Addendum)
Anesthesia Evaluation  Patient identified by MRN, date of birth, ID band Patient awake    Reviewed: Allergy & Precautions, NPO status , Patient's Chart, lab work & pertinent test results  History of Anesthesia Complications Negative for: history of anesthetic complications  Airway Mallampati: II  TM Distance: >3 FB Neck ROM: Full    Dental  (+) Dental Advisory Given   Pulmonary neg pulmonary ROS,    breath sounds clear to auscultation       Cardiovascular negative cardio ROS   Rhythm:Regular Rate:Normal     Neuro/Psych negative neurological ROS     GI/Hepatic negative GI ROS, Neg liver ROS,   Endo/Other  Morbid obesity  Renal/GU negative Renal ROS     Musculoskeletal   Abdominal (+) + obese,   Peds  Hematology negative hematology ROS (+)   Anesthesia Other Findings   Reproductive/Obstetrics                          Anesthesia Physical Anesthesia Plan  ASA: II  Anesthesia Plan:    Post-op Pain Management:    Induction: Intravenous  Airway Management Planned: Oral ETT  Additional Equipment:   Intra-op Plan:   Post-operative Plan: Extubation in OR  Informed Consent: I have reviewed the patients History and Physical, chart, labs and discussed the procedure including the risks, benefits and alternatives for the proposed anesthesia with the patient or authorized representative who has indicated his/her understanding and acceptance.   Dental advisory given  Plan Discussed with: CRNA and Surgeon  Anesthesia Plan Comments: (Plan routine monitors, GETA)        Anesthesia Quick Evaluation

## 2015-10-19 NOTE — Anesthesia Postprocedure Evaluation (Signed)
Anesthesia Post Note  Patient: Mary Ray  Procedure(s) Performed: Procedure(s) (LRB):  HYSTERECTOMY ABDOMINAL WITH RIGHT SALPINGECTOMYand left oophorectomy ([name])  Patient location during evaluation: [facility] Anesthesia Type: General Level of consciousness: awake Pain management: pain level controlled Vital Signs Assessment: post-procedure vital signs reviewed and stable Respiratory status: patient connected to nasal cannula oxygen and spontaneous breathing Cardiovascular status: stable Postop Assessment: no signs of nausea or vomiting and adequate PO intake Anesthetic complications: no    Last Vitals:  Filed Vitals:   10/19/15 1440 10/19/15 1444  BP:  156/85  Pulse:  104  Temp:    Resp: 22 20    Last Pain:  Filed Vitals:   10/19/15 1544  PainSc: 3                  Watson Robarge Hristova

## 2015-10-19 NOTE — Anesthesia Postprocedure Evaluation (Signed)
Anesthesia Post Note  Patient: Quincie N Wojtowicz  Procedure(s) Performed: Procedure(s) (LRB):  HYSTERECTOMY ABDOMINAL WITH RIGHT SALPINGECTOMYand left oophorectomy ([name])  Patient location during evaluation: PACU Anesthesia Type: General Level of consciousness: awake and alert, oriented and patient cooperative Pain management: pain level controlled Vital Signs Assessment: post-procedure vital signs reviewed and stable Respiratory status: spontaneous breathing, nonlabored ventilation and respiratory function stable Cardiovascular status: blood pressure returned to baseline and stable Postop Assessment: no signs of nausea or vomiting Anesthetic complications: no    Last Vitals:  Filed Vitals:   10/19/15 0945 10/19/15 1000  BP: 154/77 146/79  Pulse: 84 85  Temp:    Resp: 22 14    Last Pain:  Filed Vitals:   10/19/15 1022  PainSc: 8                  Rhealynn Myhre,E. Letasha Kershaw

## 2015-10-19 NOTE — Op Note (Signed)
Preoperative diagnosis: Pelvic pain, menorrhagia  Postoperative diagnosis: Same  Procedure: Exam under anesthesia, followed by TAH LO right salpingectomy  Surgeon: Matthew Saras  Asst.: McComb  EBL: 5 50 cc  Drains: Foley  Specimens removed: Uterus, left ovary, right tube, all to pathology  Procedure and findings:  The patient taken the operating room after an adequate level of general anesthesia was obtained with the patient's legs in stirrups the abdomen perineum and vagina were prepped separately and the bladder was drained. As noted in prior exam, she had a very narrow angle with no descent on placement of tenaculum on the cervix. Decision made proceed with TAH. The legs were placed flat or, incision made in her old C-section scar this is transverse carried down the fascia which was incised and extended transversely. Rectus muscle divided in the midline, peritoneum entered superiorly without incident and extended vertically. O'Connor-O'Sullivan retractor was positioned bowel Speck sparely out of the field. Long Kelly clamps were then placed at each utero-ovarian pedicle. Left tube and in previously removed starting on the left initially plans conserved the left ovary due to some mild bleeding, the course of the ureter on that side was traced out and left nephrectomy was carried out with excellent hemostasis. Peritoneum was carried around to midline on the opposite side the round ligament was clamped divided and suture ligated the utero-ovarian pedicles clamped divided and suture ligated with 0 Vicryl conserving the right ovary. The LigaSure was then used to coagulate and divide the mesosalpinx removing the right tube, sent to pathology.  Peritoneum carried around the midline the bladder flap was developed and advanced inferiorly with sharp and blunt dissection, this was below the cervical vaginal junction. In sequential manner, staying close to the uterus, the uterine vasculature pedicle, cardinal  ligament, uterosacral ligament and cervical vaginal pedicles were clamped divided and suture ligated with 0 Vicryl. The specimen was thus excised.  Vaginal cuff was closed with interrupted 2-0 Vicryl sutures with excellent hemostasis. The appendix was inspected was unremarkable.  Pelvis is irrigated and aspirated the operative site was noted be hemostatic. Prior to closure sponge, needle, S precast reported as correct 2 after the retractor was removed. Peritoneum closed with 3-0 Vicryl suture the same was used to reapproximate the rectus muscles in the midline. A double looped 0 PDS was then used to close the fascia transversely. Diluted Exparel solution 50 cc/in 100 of saline was then used to infiltrate the subfascial and subcutaneous spaces for postoperative anesthesia.  The subcutaneous tissue was undermined to reduce tension this was irrigated and made hemostatic with the Bovie. 4-0 Monocryl subcuticular closure with a pressure dressing was applied clear urine noted in the case, she tolerated this well went to recovery room in good condition.  Dictated with [place].

## 2015-10-19 NOTE — Addendum Note (Signed)
Addendum  created 10/19/15 1549 by Hewitt Blade, CRNA   Modules edited: Clinical Notes   Clinical Notes:  File: [id number]

## 2015-10-20 ENCOUNTER — Encounter (HOSPITAL_COMMUNITY): Payer: Self-pay | Admitting: Obstetrics and Gynecology

## 2015-10-20 LAB — CBC
HEMATOCRIT: 29.3 % — AB (ref 36.0–46.0)
HEMOGLOBIN: 9.6 g/dL — AB (ref 12.0–15.0)
MCH: 27.7 pg (ref 26.0–34.0)
MCHC: 32.8 g/dL (ref 30.0–36.0)
MCV: 84.4 fL (ref 78.0–100.0)
Platelets: 431 10*3/uL — ABNORMAL HIGH (ref 150–400)
RBC: 3.47 MIL/uL — ABNORMAL LOW (ref 3.87–5.11)
RDW: 14.3 % (ref 11.5–15.5)
WBC: 15.2 10*3/uL — ABNORMAL HIGH (ref 4.0–10.5)

## 2015-10-20 MED ORDER — HYDROMORPHONE HCL 2 MG PO TABS
2.0000 mg | ORAL_TABLET | ORAL | Status: DC | PRN
  Administered 2015-10-20 – 2015-10-21 (×8): 2 mg via ORAL
  Filled 2015-10-20 (×8): qty 1

## 2015-10-20 MED ORDER — IBUPROFEN 800 MG PO TABS
800.0000 mg | ORAL_TABLET | Freq: Three times a day (TID) | ORAL | Status: DC | PRN
  Administered 2015-10-20 – 2015-10-21 (×3): 800 mg via ORAL
  Filled 2015-10-20 (×3): qty 1

## 2015-10-20 NOTE — Progress Notes (Signed)
1 Day Post-Op Procedure(s) (LRB):  HYSTERECTOMY ABDOMINAL WITH RIGHT SALPINGECTOMYand left oophorectomy ([name])  Subjective: Patient reports tolerating PO.    Objective: I have reviewed patient's vital signs, medications and labs.  CBC    Component Value Date/Time   WBC 15.2* 10/20/2015 0610   RBC 3.47* 10/20/2015 0610   HGB 9.6* 10/20/2015 0610   HCT 29.3* 10/20/2015 0610   PLT 431* 10/20/2015 0610   MCV 84.4 10/20/2015 0610   MCH 27.7 10/20/2015 0610   MCHC 32.8 10/20/2015 0610   RDW 14.3 10/20/2015 0610   LYMPHSABS 0.8 05/31/2013 0140   MONOABS 0.9 05/31/2013 0140   EOSABS 0.0 05/31/2013 0140   BASOSABS 0.0 05/31/2013 0140      Assessment: s/p Procedure(s):  HYSTERECTOMY ABDOMINAL WITH RIGHT SALPINGECTOMYand left oophorectomy ([name]): stable  Plan: Advance diet Encourage ambulation Advance to PO medication  LOS: 1 day    [facility] M 10/20/2015, 7:56 AM

## 2015-10-20 NOTE — Progress Notes (Signed)
I received a consult to help pt create an advance directive.  I went over paperwork with her.  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  I let her know that if she does decide to fill it out, she can have it notarized here if she brings two witnesses who are not related to her.  She reports that she is otherwise doing well and is glad to have been able to have the surgery for this ongoing problem of over 4 years.  [place], [place] Pager, [phone] 3:31 PM    10/20/15 1500  Clinical Encounter Type  Visited With Patient  Visit Type Initial  Referral From Nurse  Spiritual Encounters  Spiritual Needs (Advance Directive.)

## 2015-10-21 MED ORDER — FERROUS SULFATE 325 (65 FE) MG PO TABS: 325.0000 mg | ORAL_TABLET | Freq: Every day | ORAL | Status: DC

## 2015-10-21 MED ORDER — HYDROMORPHONE HCL 2 MG PO TABS: 2.0000 mg | ORAL_TABLET | ORAL | Status: DC | PRN

## 2015-10-21 MED ORDER — IBUPROFEN 800 MG PO TABS: 800.0000 mg | ORAL_TABLET | Freq: Three times a day (TID) | ORAL | Status: DC | PRN

## 2015-10-21 NOTE — Progress Notes (Signed)
Discharge instructions reviewed with patient.  Patient states understanding of home care, medications, activity, signs/symptoms to report to MD and return MD office visit.  Patients significant other and family will assist with her care @ home.  No home  equipment needed, patient has prescriptions and all personal belongings.  Patient discharged per wheelchair in stable condition with staff without incident.  

## 2015-10-21 NOTE — Discharge Summary (Signed)
Physician Discharge Summary  Patient ID: Mary Ray MRN: [id number] DOB/AGE: 03/07/1976 40 y.o.  Admit date: 10/19/2015 Discharge date: 10/21/2015  Admission Diagnoses:menorrhagia, pelvic pain  Discharge Diagnoses: same Active Problems:   Menorrhagia   Discharged Condition: good  Hospital Course: adm for [facility] , poss TAH, underwent TAH LO, R sal;pingectomy, on POD 1, diet addvanced, by POD 2 remained afeb, tol Reg diet and ready for D/C  Consults: None  Significant Diagnostic Studies: labs:  CBC    Component Value Date/Time   WBC 15.2* 10/20/2015 0610   RBC 3.47* 10/20/2015 0610   HGB 9.6* 10/20/2015 0610   HCT 29.3* 10/20/2015 0610   PLT 431* 10/20/2015 0610   MCV 84.4 10/20/2015 0610   MCH 27.7 10/20/2015 0610   MCHC 32.8 10/20/2015 0610   RDW 14.3 10/20/2015 0610   LYMPHSABS 0.8 05/31/2013 0140   MONOABS 0.9 05/31/2013 0140   EOSABS 0.0 05/31/2013 0140   BASOSABS 0.0 05/31/2013 0140      Treatments: surgery: TAH LO,R salpingectomy  Discharge Exam: Blood pressure 124/46, pulse 88, temperature 98.4 F (36.9 C), temperature source Oral, resp. rate 16, height 5\' 7"  (1.702 m), weight 326 lb (147.873 kg), SpO2 98 %. General appearance: alert GI: soft + BS, bandage dry  Disposition: 01-Home or Self Care     Medication List    TAKE these medications        EPINEPHrine 0.3 mg/0.3 mL Soaj injection  Commonly known as:  EPIPEN  Inject 0.3 mLs (0.3 mg total) into the muscle once.     ferrous sulfate 325 (65 FE) MG tablet  Commonly known as:  FERROUSUL  Take 1 tablet (325 mg total) by mouth daily with breakfast.     HYDROmorphone 2 MG tablet  Commonly known as:  DILAUDID  Take 1 tablet (2 mg total) by mouth every 3 (three) hours as needed for severe pain.     ibuprofen 800 MG tablet  Commonly known as:  ADVIL,MOTRIN  Take 1 tablet (800 mg total) by mouth every 8 (eight) hours as needed for moderate pain.           Follow-up Information    Follow  up with Margarette Asal, MD. Schedule an appointment as soon as possible for a visit in 1 week.   Specialty:  Obstetrics and Gynecology   Contact information:   [address] [place] [phone]       Signed: Margarette Asal 10/21/2015, 6:51 AM

## 2016-04-14 ENCOUNTER — Other Ambulatory Visit: Payer: Self-pay | Admitting: Family

## 2016-04-14 ENCOUNTER — Ambulatory Visit
Admission: RE | Admit: 2016-04-14 | Discharge: 2016-04-14 | Disposition: A | Discharge status: Home / Self Care | Payer: 59 | Source: Ambulatory Visit | Attending: Family | Admitting: Family

## 2016-04-14 DIAGNOSIS — Z111 Encounter for screening for respiratory tuberculosis: Secondary | ICD-10-CM

## 2016-06-13 ENCOUNTER — Emergency Department (HOSPITAL_COMMUNITY)
Admission: EM | Admit: 2016-06-13 | Discharge: 2016-06-13 | Disposition: A | Discharge status: Home / Self Care | Payer: Self-pay | Attending: Emergency Medicine | Admitting: Emergency Medicine

## 2016-06-13 ENCOUNTER — Encounter (HOSPITAL_COMMUNITY): Payer: Self-pay | Admitting: Emergency Medicine

## 2016-06-13 ENCOUNTER — Emergency Department (HOSPITAL_COMMUNITY): Payer: Self-pay

## 2016-06-13 DIAGNOSIS — Z79899 Other long term (current) drug therapy: Secondary | ICD-10-CM | POA: Insufficient documentation

## 2016-06-13 DIAGNOSIS — R1013 Epigastric pain: Secondary | ICD-10-CM

## 2016-06-13 DIAGNOSIS — K8020 Calculus of gallbladder without cholecystitis without obstruction: Secondary | ICD-10-CM | POA: Insufficient documentation

## 2016-06-13 DIAGNOSIS — K8080 Other cholelithiasis without obstruction: Secondary | ICD-10-CM

## 2016-06-13 LAB — COMPREHENSIVE METABOLIC PANEL
ALT: 13 U/L — AB (ref 14–54)
AST: 17 U/L (ref 15–41)
Albumin: 3.7 g/dL (ref 3.5–5.0)
Alkaline Phosphatase: 43 U/L (ref 38–126)
Anion gap: 4 — ABNORMAL LOW (ref 5–15)
BILIRUBIN TOTAL: 0.5 mg/dL (ref 0.3–1.2)
BUN: 14 mg/dL (ref 6–20)
CALCIUM: 9 mg/dL (ref 8.9–10.3)
CO2: 22 mmol/L (ref 22–32)
CREATININE: 0.79 mg/dL (ref 0.44–1.00)
Chloride: 111 mmol/L (ref 101–111)
GFR calc Af Amer: >60 mL/min (ref >60)
Glucose, Bld: 112 mg/dL — ABNORMAL HIGH (ref 65–99)
POTASSIUM: 4.1 mmol/L (ref 3.5–5.1)
Sodium: 137 mmol/L (ref 135–145)
TOTAL PROTEIN: 7.6 g/dL (ref 6.5–8.1)

## 2016-06-13 LAB — CBC WITH DIFFERENTIAL/PLATELET
BASOS ABS: 0 10*3/uL (ref 0.0–0.1)
BASOS PCT: 0 %
EOS ABS: 0.1 10*3/uL (ref 0.0–0.7)
EOS PCT: 1 %
HCT: 35.3 % — ABNORMAL LOW (ref 36.0–46.0)
Hemoglobin: 11.9 g/dL — ABNORMAL LOW (ref 12.0–15.0)
Lymphocytes Relative: 26 %
Lymphs Abs: 1.6 10*3/uL (ref 0.7–4.0)
MCH: 28.1 pg (ref 26.0–34.0)
MCHC: 33.7 g/dL (ref 30.0–36.0)
MCV: 83.3 fL (ref 78.0–100.0)
MONO ABS: 0.7 10*3/uL (ref 0.1–1.0)
Monocytes Relative: 11 %
Neutro Abs: 3.8 10*3/uL (ref 1.7–7.7)
Neutrophils Relative %: 62 %
PLATELETS: 361 10*3/uL (ref 150–400)
RBC: 4.24 MIL/uL (ref 3.87–5.11)
RDW: 13.4 % (ref 11.5–15.5)
WBC: 6.2 10*3/uL (ref 4.0–10.5)

## 2016-06-13 LAB — LIPASE, BLOOD: LIPASE: 24 U/L (ref 11–51)

## 2016-06-13 MED ORDER — ONDANSETRON HCL 4 MG/2ML IJ SOLN
4.0000 mg | Freq: Once | INTRAMUSCULAR | Status: AC
  Administered 2016-06-13: 4 mg via INTRAVENOUS
  Filled 2016-06-13: qty 2

## 2016-06-13 MED ORDER — SODIUM CHLORIDE 0.9 % IV BOLUS (SEPSIS)
1000.0000 mL | Freq: Once | INTRAVENOUS | Status: AC
  Administered 2016-06-13: 1000 mL via INTRAVENOUS

## 2016-06-13 MED ORDER — HYDROCODONE-ACETAMINOPHEN 5-325 MG PO TABS: 1.0000 | ORAL_TABLET | Freq: Four times a day (QID) | ORAL | 0 refills | Status: DC | PRN

## 2016-06-13 NOTE — ED Provider Notes (Signed)
[facility] DEPT Provider Note   CSN: [id number] Arrival date & time: 06/13/16  0429     History   Chief Complaint Chief Complaint  Patient presents with  . Abdominal Pain    HPI Mary Ray is a 39 y.o. female.  Patient is a 40 year old female with history of hysterectomy. She presents with complaints of epigastric abdominal pain that started yesterday. She reports eating a large quantity of food prior to the onset of symptoms. She denies any fevers or chills. She feels nauseated but denies vomiting. She denies any fevers or chills. She denies any diarrhea or constipation.   The history is provided by the patient.  Abdominal Pain   This is a new problem. The current episode started 3 to 5 hours ago. The problem occurs constantly. The problem has been gradually worsening. The pain is associated with eating. The pain is located in the epigastric region. The pain is moderate. Pertinent negatives include anorexia, fever and constipation. Nothing aggravates the symptoms. Nothing relieves the symptoms.    History reviewed. No pertinent past medical history.  Patient Active Problem List   Diagnosis Date Noted  . Menorrhagia 10/19/2015  . Threatened preterm labor, antepartum 06/18/2011    Past Surgical History:  Procedure Laterality Date  . ABDOMINAL HYSTERECTOMY [name] 10/19/2015   Procedure:  HYSTERECTOMY ABDOMINAL WITH RIGHT SALPINGECTOMYand left oophorectomy;  Surgeon: Molli Posey, MD;  Location: [facility];  Service: Gynecology;  Laterality: [name];  . BREAST REDUCTION SURGERY    . BREAST SURGERY    . CESAREAN SECTION  08/16/2011   Procedure: CESAREAN SECTION;  Surgeon: Darlyn Chamber;  Location: [facility];  Service: Gynecology;  Laterality: [name];  . DIAGNOSTIC LAPAROSCOPY    . SALPINGECTOMY  2004   Scar tissue???  . WISDOM TOOTH EXTRACTION      OB History    Gravida Para Term Preterm AB Living   1 1 1  0 0 1   SAB TAB Ectopic Multiple Live Births   0 0 0 0 1        Home Medications    Prior to Admission medications   Medication Sig Start Date End Date Taking? Authorizing Provider  EPINEPHrine (EPIPEN) 0.3 mg/0.3 mL IJ SOAJ injection Inject 0.3 mLs (0.3 mg total) into the muscle once. 03/13/14  Yes Mariel Aloe, MD  ferrous sulfate (FERROUSUL) 325 (65 FE) MG tablet Take 1 tablet (325 mg total) by mouth daily with breakfast. Patient not taking: Reported on 06/13/2016 10/21/15   Molli Posey, MD  HYDROmorphone (DILAUDID) 2 MG tablet Take 1 tablet (2 mg total) by mouth every 3 (three) hours as needed for severe pain. Patient not taking: Reported on 06/13/2016 10/21/15   Molli Posey, MD  ibuprofen (ADVIL,MOTRIN) 800 MG tablet Take 1 tablet (800 mg total) by mouth every 8 (eight) hours as needed for moderate pain. Patient not taking: Reported on 06/13/2016 10/21/15   Molli Posey, MD    Family History Family History  Problem Relation Age of Onset  . Diabetes Mother   . Hypertension Mother   . Diabetes Father   . Stroke Father   . Hypertension Father   . Hypertension Sister   . Learning disabilities Cousin     autism  . Anesthesia problems Neg Hx   . Hypotension Neg Hx   . Malignant hyperthermia Neg Hx   . Pseudochol deficiency Neg Hx     Social History Social History  Substance Use Topics  . Smoking status: Never Smoker  .  Smokeless tobacco: Never Used  . Alcohol use No     Allergies   Vioxx [rofecoxib] and Clindamycin/lincomycin   Review of Systems Review of Systems  Constitutional: Negative for fever.  Gastrointestinal: Positive for abdominal pain. Negative for anorexia and constipation.  All other systems reviewed and are negative.    Physical Exam Updated Vital Signs BP 126/90 (BP Location: Left Arm)   Pulse 83   Temp 98.2 F (36.8 C) (Oral)   Resp 16   Ht 5\' 8"  (1.727 m)   Wt (!) 310 lb (140.6 kg)   LMP 09/28/2015 (Exact Date)   SpO2 98%   BMI 47.14 kg/m   Physical Exam  Constitutional: She is  oriented to person, place, and time. She appears well-developed and well-nourished. No distress.  HENT:  Head: Normocephalic and atraumatic.  Neck: Normal range of motion. Neck supple.  Cardiovascular: Normal rate and regular rhythm.  Exam reveals no gallop and no friction rub.   No murmur heard. Pulmonary/Chest: Effort normal and breath sounds normal. No respiratory distress. She has no wheezes.  Abdominal: Soft. Bowel sounds are normal. She exhibits no distension. There is tenderness. There is no rebound and no guarding.  There is tenderness to palpation in the epigastric region.  Musculoskeletal: Normal range of motion.  Neurological: She is alert and oriented to person, place, and time.  Skin: Skin is warm and dry. She is not diaphoretic.  Nursing note and vitals reviewed.    ED Treatments / Results  Labs (all labs ordered are listed, but only abnormal results are displayed) Labs Reviewed  COMPREHENSIVE METABOLIC PANEL  LIPASE, BLOOD  CBC WITH DIFFERENTIAL/PLATELET    EKG  EKG Interpretation None       Radiology No results found.  Procedures Procedures (including critical care time)  Medications Ordered in ED Medications  sodium chloride 0.9 % bolus 1,000 mL (not administered)  ondansetron (ZOFRAN) injection 4 mg (not administered)     Initial Impression / Assessment and Plan / ED Course  I have reviewed the triage vital signs and the nursing notes.  Pertinent labs & imaging results that were available during my care of the patient were reviewed by me and considered in my medical decision making (see chart for details).  Clinical Course    Patient presents with epigastric pain that started after eating a large meal. Her laboratory studies are unremarkable. Ultrasound of the gallbladder does reveal a gallstone, however no evidence for cholecystitis. She is afebrile and is now feeling better after fluids and medications administered in the ER. At this point, I  feel as though she is appropriate to be discharged. She will be given pain medicine if her symptoms recur. She is to return to the ER if symptoms significantly worsen or she becomes febrile or cannot keep anything down. She will also be given the number for [facility] [place] [facility] with whom she can follow-up if she continues to have these episodes.  Final Clinical Impressions(s) / ED Diagnoses   Final diagnoses:  Epigastric pain    New Prescriptions New Prescriptions   No medications on file     Veryl Speak, MD 06/13/16 [phone]

## 2016-06-13 NOTE — ED Triage Notes (Signed)
Pt reports nausea and sharp abd pain that started at 2000 on 06/12/16. Denies any vomiting but reports bloating in abd.

## 2016-06-13 NOTE — Discharge Instructions (Signed)
Hydrocodone as prescribed as needed for recurrent pain.  Return to the emergency department if you develop worsening pain, high fever, bloody stools, or other new and concerning symptoms.  If you continue to have these episodes, follow-up with [facility] to discuss your gallstones. The contact information has been provided in this discharge summary for you to call and make these arrangements.

## 2017-08-25 ENCOUNTER — Other Ambulatory Visit: Payer: Self-pay

## 2017-08-25 ENCOUNTER — Emergency Department (HOSPITAL_COMMUNITY)
Admission: EM | Admit: 2017-08-25 | Discharge: 2017-08-25 | Disposition: A | Discharge status: Home / Self Care | Payer: BC Managed Care – PPO | Attending: Emergency Medicine | Admitting: Emergency Medicine

## 2017-08-25 ENCOUNTER — Encounter (HOSPITAL_COMMUNITY): Payer: Self-pay | Admitting: Emergency Medicine

## 2017-08-25 ENCOUNTER — Emergency Department (HOSPITAL_COMMUNITY): Payer: BC Managed Care – PPO

## 2017-08-25 DIAGNOSIS — R1011 Right upper quadrant pain: Secondary | ICD-10-CM | POA: Diagnosis present

## 2017-08-25 DIAGNOSIS — Z883 Allergy status to other anti-infective agents status: Secondary | ICD-10-CM | POA: Insufficient documentation

## 2017-08-25 DIAGNOSIS — K8020 Calculus of gallbladder without cholecystitis without obstruction: Secondary | ICD-10-CM | POA: Insufficient documentation

## 2017-08-25 LAB — URINALYSIS, ROUTINE W REFLEX MICROSCOPIC
Bacteria, UA: NONE SEEN
Bilirubin Urine: NEGATIVE
GLUCOSE, UA: NEGATIVE mg/dL
Ketones, ur: NEGATIVE mg/dL
Leukocytes, UA: NEGATIVE
NITRITE: NEGATIVE
PH: 5 (ref 5.0–8.0)
PROTEIN: NEGATIVE mg/dL
SPECIFIC GRAVITY, URINE: 1.018 (ref 1.005–1.030)

## 2017-08-25 LAB — COMPREHENSIVE METABOLIC PANEL
ALBUMIN: 3.8 g/dL (ref 3.5–5.0)
ALK PHOS: 50 U/L (ref 38–126)
ALT: 17 U/L (ref 14–54)
ANION GAP: 9 (ref 5–15)
AST: 20 U/L (ref 15–41)
BILIRUBIN TOTAL: 0.4 mg/dL (ref 0.3–1.2)
BUN: 12 mg/dL (ref 6–20)
CALCIUM: 9.2 mg/dL (ref 8.9–10.3)
CO2: 23 mmol/L (ref 22–32)
CREATININE: 0.79 mg/dL (ref 0.44–1.00)
Chloride: 103 mmol/L (ref 101–111)
GFR calc Af Amer: >60 mL/min (ref >60)
GFR calc non Af Amer: >60 mL/min (ref >60)
GLUCOSE: 120 mg/dL — AB (ref 65–99)
Potassium: 3.9 mmol/L (ref 3.5–5.1)
SODIUM: 135 mmol/L (ref 135–145)
TOTAL PROTEIN: 8.1 g/dL (ref 6.5–8.1)

## 2017-08-25 LAB — CBC
HCT: 39.4 % (ref 36.0–46.0)
HEMOGLOBIN: 13.2 g/dL (ref 12.0–15.0)
MCH: 29.1 pg (ref 26.0–34.0)
MCHC: 33.5 g/dL (ref 30.0–36.0)
MCV: 86.8 fL (ref 78.0–100.0)
PLATELETS: 409 10*3/uL — AB (ref 150–400)
RBC: 4.54 MIL/uL (ref 3.87–5.11)
RDW: 13.2 % (ref 11.5–15.5)
WBC: 8.4 10*3/uL (ref 4.0–10.5)

## 2017-08-25 LAB — LIPASE, BLOOD: Lipase: 30 U/L (ref 11–51)

## 2017-08-25 MED ORDER — ONDANSETRON 4 MG PO TBDP
4.0000 mg | ORAL_TABLET | Freq: Once | ORAL | Status: AC | PRN
  Administered 2017-08-25: 4 mg via ORAL
  Filled 2017-08-25: qty 1

## 2017-08-25 MED ORDER — MORPHINE SULFATE (PF) 4 MG/ML IV SOLN
4.0000 mg | Freq: Once | INTRAVENOUS | Status: AC
  Administered 2017-08-25: 4 mg via INTRAVENOUS
  Filled 2017-08-25: qty 1

## 2017-08-25 NOTE — ED Notes (Signed)
US in room 

## 2017-08-25 NOTE — ED Notes (Signed)
Walked feels light headed but no other issues noted at this time

## 2017-08-25 NOTE — ED Provider Notes (Signed)
[facility] DEPT Provider Note   CSN: [id number] Arrival date & time: 08/25/17  0216     History   Chief Complaint Chief Complaint  Patient presents with  . Abdominal Pain    HPI Mary Ray is a 41 y.o. female with a hx of abd hysterectomy presents to the Emergency Department complaining of gradual, persistent, progressively worsening RUQ and epigastric abd pain onset around 9 PM this evening.  Patient reports she ate [place] fries around 6 PM but was otherwise feeling well.  She states pain is a 10/10.  Pain is sharp/stabbing and does not radiate.  Pt reports all movement makes her symptoms worse and nothing makes them better. She denies fever, chills, headache, neck pain, chest pain, shortness of breath, nausea, vomiting, diarrhea, weakness, dizziness, syncope.  Patient has a history of abdominal hysterectomy but no other abdominal surgeries.  She reports she does not drink alcohol and denies NSAID usage.  No treatments prior to arrival.  The history is provided by the patient and medical records. No language interpreter was used.    History reviewed. No pertinent past medical history.  Patient Active Problem List   Diagnosis Date Noted  . Menorrhagia 10/19/2015  . Threatened preterm labor, antepartum 06/18/2011    Past Surgical History:  Procedure Laterality Date  . ABDOMINAL HYSTERECTOMY [name] 10/19/2015   Procedure:  HYSTERECTOMY ABDOMINAL WITH RIGHT SALPINGECTOMYand left oophorectomy;  Surgeon: Molli Posey, MD;  Location: [facility];  Service: Gynecology;  Laterality: [name];  . BREAST REDUCTION SURGERY    . BREAST SURGERY    . CESAREAN SECTION  08/16/2011   Procedure: CESAREAN SECTION;  Surgeon: Darlyn Chamber;  Location: [facility];  Service: Gynecology;  Laterality: [name];  . DIAGNOSTIC LAPAROSCOPY    . SALPINGECTOMY  2004   Scar tissue???  . WISDOM TOOTH EXTRACTION      OB History    Gravida Para Term Preterm AB Living   1 1 1  0 0 1   SAB TAB Ectopic Multiple Live Births   0 0 0 0 1       Home Medications    Prior to Admission medications   Medication Sig Start Date End Date Taking? Authorizing Provider  cyclobenzaprine (FLEXERIL) 10 MG tablet Take 10 mg by mouth 3 (three) times daily as needed for muscle spasms.  08/11/17   [provider]  EPINEPHrine (EPIPEN) 0.3 mg/0.3 mL IJ SOAJ injection Inject 0.3 mLs (0.3 mg total) into the muscle once. 03/13/14   Mariel Aloe, MD  meloxicam (MOBIC) 15 MG tablet Take 15 mg by mouth daily. 08/11/17   [provider]  traMADol (ULTRAM) 50 MG tablet Take 50 mg by mouth 2 (two) times daily as needed for moderate pain.  08/11/17   [provider]    Family History Family History  Problem Relation Age of Onset  . Diabetes Mother   . Hypertension Mother   . Diabetes Father   . Stroke Father   . Hypertension Father   . Hypertension Sister   . Learning disabilities Cousin        autism  . Anesthesia problems Neg Hx   . Hypotension Neg Hx   . Malignant hyperthermia Neg Hx   . Pseudochol deficiency Neg Hx     Social History Social History   Tobacco Use  . Smoking status: Never Smoker  . Smokeless tobacco: Never Used  Substance Use Topics  . Alcohol use: No  . Drug use:  No     Allergies   Vioxx [rofecoxib] and Clindamycin/lincomycin   Review of Systems Review of Systems  Constitutional: Negative for appetite change, diaphoresis, fatigue, fever and unexpected weight change.  HENT: Negative for mouth sores.   Eyes: Negative for visual disturbance.  Respiratory: Negative for cough, chest tightness, shortness of breath and wheezing.   Cardiovascular: Negative for chest pain.  Gastrointestinal: Positive for abdominal pain. Negative for constipation, diarrhea, nausea and vomiting.  Endocrine: Negative for polydipsia, polyphagia and polyuria.  Genitourinary: Negative for dysuria, frequency, hematuria and urgency.  Musculoskeletal:  Negative for back pain and neck stiffness.  Skin: Negative for rash.  Allergic/Immunologic: Negative for immunocompromised state.  Neurological: Negative for syncope, light-headedness and headaches.  Hematological: Does not bruise/bleed easily.  Psychiatric/Behavioral: Negative for sleep disturbance. The patient is not nervous/anxious.      Physical Exam Updated Vital Signs BP (!) 159/81 (BP Location: Right Arm)   Pulse 91   Temp 97.8 F (36.6 C) (Oral)   Resp 18   Ht 5\' 10"  (1.778 m)   Wt (!) 158.8 kg (350 lb)   LMP 09/28/2015 (Exact Date)   SpO2 99%   BMI 50.22 kg/m   Physical Exam  Constitutional: She appears well-developed and well-nourished. No distress.  Awake, alert, nontoxic appearance  HENT:  Head: Normocephalic and atraumatic.  Mouth/Throat: Oropharynx is clear and moist. No oropharyngeal exudate.  Eyes: Conjunctivae are normal. No scleral icterus.  Neck: Normal range of motion. Neck supple.  Cardiovascular: Normal rate, regular rhythm and intact distal pulses.  Pulmonary/Chest: Effort normal and breath sounds normal. No respiratory distress. She has no wheezes.  Equal chest expansion  Abdominal: Soft. Bowel sounds are normal. She exhibits distension. She exhibits no mass. There is tenderness in the right upper quadrant and epigastric area. There is guarding and positive Murphy's sign. There is no rigidity, no rebound, no CVA tenderness and no tenderness at McBurney's point.  Musculoskeletal: Normal range of motion. She exhibits no edema.  Neurological: She is alert.  Speech is clear and goal oriented Moves extremities without ataxia  Skin: Skin is warm and dry. She is not diaphoretic.  Psychiatric: She has a normal mood and affect.  Nursing note and vitals reviewed.    ED Treatments / Results  Labs (all labs ordered are listed, but only abnormal results are displayed) Labs Reviewed  COMPREHENSIVE METABOLIC PANEL - Abnormal; Notable for the following  components:      Result Value   Glucose, Bld 120 (*)    All other components within normal limits  CBC - Abnormal; Notable for the following components:   Platelets 409 (*)    All other components within normal limits  URINALYSIS, ROUTINE W REFLEX MICROSCOPIC - Abnormal; Notable for the following components:   Hgb urine dipstick MODERATE (*)    Squamous Epithelial / LPF 0-5 (*)    All other components within normal limits  LIPASE, BLOOD     Radiology US Abdomen Complete  Result Date: 08/25/2017 CLINICAL DATA:  Acute onset of right upper quadrant and epigastric abdominal pain. EXAM: ABDOMEN ULTRASOUND COMPLETE COMPARISON:  Right upper quadrant ultrasound performed 06/13/2016 FINDINGS: Gallbladder: A large 2.1 cm stone is seen lodged at the base of the gallbladder. Sludge is also noted within the gallbladder. No gallbladder wall thickening or pericholecystic fluid is seen. No ultrasonographic Murphy's sign is elicited. Common bile duct: Diameter: 0.3 cm, within normal limits in caliber. Liver: No focal lesion identified. Mildly increased parenchymal echogenicity and coarsened  echotexture, compatible with fatty infiltration. Portal vein is patent on color Doppler imaging with normal direction of blood flow towards the liver. IVC: No abnormality visualized. Pancreas: Not visualized. Spleen: Not visualized. Right Kidney: Length: 12.2 cm. Echogenicity within normal limits. No mass or hydronephrosis visualized. Left Kidney:  Not visualized. Abdominal aorta: No aneurysm visualized. The distal abdominal aorta and aortic bifurcation are obscured by overlying bowel gas. Other findings: None. IMPRESSION: 1. No acute abnormality seen within the abdomen. Evaluation is suboptimal due to overlying bowel gas. 2. Cholelithiasis, with a 2.1 cm stone lodged at the base of the gallbladder. Sludge noted within the gallbladder. Gallbladder otherwise unremarkable in appearance. 3. Mild fatty infiltration within the  liver. Electronically Signed   By: Garald Balding M.D.   On: 08/25/2017 04:41   Dg Abdomen Acute W/chest  Result Date: 08/25/2017 CLINICAL DATA:  Acute onset of right upper quadrant abdominal pain. EXAM: DG ABDOMEN ACUTE W/ 1V CHEST COMPARISON:  Chest radiograph performed 04/14/2016, and CT of the abdomen and pelvis performed 05/31/2013 FINDINGS: The lungs are well-aerated. Pulmonary vascularity is at the upper limits of normal. There is no evidence of focal opacification, pleural effusion or pneumothorax. The cardiomediastinal silhouette is within normal limits. The visualized bowel gas pattern is unremarkable. Scattered stool and air are seen within the colon; there is no evidence of small bowel dilatation to suggest obstruction. No free intra-abdominal air is identified on the provided upright view. No acute osseous abnormalities are seen; the sacroiliac joints are unremarkable in appearance. IMPRESSION: 1. Unremarkable bowel gas pattern; no free intra-abdominal air seen. Moderate amount of stool noted in the colon. 2. No acute cardiopulmonary process seen. Electronically Signed   By: Garald Balding M.D.   On: 08/25/2017 04:22    Procedures Procedures (including critical care time)  Medications Ordered in ED Medications  morphine 4 MG/ML injection 4 mg (not administered)  ondansetron (ZOFRAN-ODT) disintegrating tablet 4 mg (4 mg Oral Given 08/25/17 0341)  morphine 4 MG/ML injection 4 mg (4 mg Intravenous Given 08/25/17 0341)     Initial Impression / Assessment and Plan / ED Course  I have reviewed the triage vital signs and the nursing notes.  Pertinent labs & imaging results that were available during my care of the patient were reviewed by me and considered in my medical decision making (see chart for details).  Clinical Course as of Aug 25 553  Fri Aug 25, 2017  0516 Pain improved, but not resolved  [HM]    Clinical Course User Index [HM] Shantera Monts, Jarrett Soho, [place]    Patient  presents with epigastric and right upper quadrant abdominal pain.  She is exquisitely tender on exam.  Positive Murphy sign.  Labs initially reassuring.  Pain medicine and fluids ordered.  Will obtain ultrasound.  5:54 AM Patient's pain continues to improve.  She is able to ambulate without difficulty and has not had any episodes of emesis.  Ultrasound shows gallstone lodged in the gallbladder but not in the neck of the gallbladder or in the common bile duct.  No evidence of cholecystitis or cholangitis.  Liver function and pancreatic enzymes are within normal limits.  Patient does not wish for surgical consult at this time.  Patient given outpatient referral.  Discussed reasons to return immediately to the emergency department including return of pain, persistent vomiting, fevers or other concerns.  She states understanding and is in agreement with this plan.  Final Clinical Impressions(s) / ED Diagnoses   Final diagnoses:  RUQ  abdominal pain  Calculus of gallbladder without cholecystitis without obstruction    ED Discharge Orders    None       Loni Muse Gwenlyn Perking 08/25/17 0555    Little, Wenda Overland, MD 08/28/17 [phone]

## 2017-08-25 NOTE — Discharge Instructions (Signed)
1. Medications: usual home medications 2. Treatment: rest, drink plenty of fluids, advance diet slowly 3. Follow Up: Please followup with your primary doctor and general surgery in 2 days for discussion of your diagnoses and further evaluation after today's visit; if you do not have a primary care doctor use the resource guide provided to find one; Please return to the ER for persistent vomiting, return or worsening pain, high fevers or worsening symptoms

## 2017-08-25 NOTE — ED Triage Notes (Signed)
Pt reports having upper abd pain that started around 2100 on 08/24/17

## 2017-08-30 ENCOUNTER — Ambulatory Visit
Admission: RE | Admit: 2017-08-30 | Discharge: 2017-08-30 | Disposition: A | Discharge status: Home / Self Care | Payer: 59 | Source: Ambulatory Visit | Attending: Family Medicine | Admitting: Family Medicine

## 2017-08-30 ENCOUNTER — Other Ambulatory Visit: Payer: Self-pay | Admitting: Family Medicine

## 2017-08-30 DIAGNOSIS — R52 Pain, unspecified: Secondary | ICD-10-CM

## 2017-09-14 ENCOUNTER — Ambulatory Visit: Payer: Self-pay | Admitting: Surgery

## 2017-09-20 NOTE — Patient Instructions (Signed)
Your procedure is scheduled on: Friday, Sep 29, 2017   Surgery Time:  10:00AM-11:30AM   Report to [facility] Main  Entrance    Report to admitting at 8:00 AM   Call this number if you have problems the morning of surgery [phone]   Do not eat food or drink liquids :After Midnight.   Do NOT smoke after Midnight   Take these medicines the morning of surgery with A SIP OF WATER: None                               You may not have any metal on your body including hair pins, jewelry, and body piercings             Do not wear make-up, lotions, powders, perfumes/cologne, or deodorant             Do not wear nail polish.  Do not shave  48 hours prior to surgery.   Do not bring valuables to the hospital. [place].   Contacts, dentures or bridgework may not be worn into surgery.   Patients discharged the day of surgery will not be allowed to drive home.   Name and phone number of your driver:   Special Instructions: Bring a copy of your healthcare power of attorney and living will documents         the day of surgery if you haven't scanned them in before.              Please read over the following fact sheets you were given:  [facility] - Preparing for Surgery Before surgery, you can play an important role.  Because skin is not sterile, your skin needs to be as free of germs as possible.  You can reduce the number of germs on your skin by washing with CHG (chlorahexidine gluconate) soap before surgery.  CHG is an antiseptic cleaner which kills germs and bonds with the skin to continue killing germs even after washing. Please DO NOT use if you have an allergy to CHG or antibacterial soaps.  If your skin becomes reddened/irritated stop using the CHG and inform your nurse when you arrive at Short Stay. Do not shave (including legs and underarms) for at least 48 hours prior to the first CHG shower.  You may shave your  face/neck.  Please follow these instructions carefully:  1.  Shower with CHG Soap the night before surgery and the  morning of surgery.  2.  If you choose to wash your hair, wash your hair first as usual with your normal  shampoo.  3.  After you shampoo, rinse your hair and body thoroughly to remove the shampoo.                             4.  Use CHG as you would any other liquid soap.  You can apply chg directly to the skin and wash.  Gently with a scrungie or clean washcloth.  5.  Apply the CHG Soap to your body ONLY FROM THE NECK DOWN.   Do   not use on face/ open  Wound or open sores. Avoid contact with eyes, ears mouth and   genitals (private parts).                       Wash face,  Genitals (private parts) with your normal soap.             6.  Wash thoroughly, paying special attention to the area where your    surgery  will be performed.  7.  Thoroughly rinse your body with warm water from the neck down.  8.  DO NOT shower/wash with your normal soap after using and rinsing off the CHG Soap.                9.  Pat yourself dry with a clean towel.            10.  Wear clean pajamas.            11.  Place clean sheets on your bed the night of your first shower and do not  sleep with pets. Day of Surgery : Do not apply any lotions/deodorants the morning of surgery.  Please wear clean clothes to the hospital/surgery center.  FAILURE TO FOLLOW THESE INSTRUCTIONS MAY RESULT IN THE CANCELLATION OF YOUR SURGERY  PATIENT SIGNATURE_________________________________  NURSE SIGNATURE__________________________________  ________________________________________________________________________

## 2017-09-21 ENCOUNTER — Encounter (HOSPITAL_COMMUNITY): Payer: Self-pay

## 2017-09-21 ENCOUNTER — Encounter (HOSPITAL_COMMUNITY)
Admission: RE | Admit: 2017-09-21 | Discharge: 2017-09-21 | Disposition: A | Discharge status: Home / Self Care | Payer: BC Managed Care – PPO | Source: Ambulatory Visit | Attending: Surgery | Admitting: Surgery

## 2017-09-21 ENCOUNTER — Other Ambulatory Visit: Payer: Self-pay

## 2017-09-21 DIAGNOSIS — Z01812 Encounter for preprocedural laboratory examination: Secondary | ICD-10-CM | POA: Diagnosis not present

## 2017-09-21 HISTORY — DX: Unspecified osteoarthritis, unspecified site: M19.90

## 2017-09-21 LAB — CBC
HEMATOCRIT: 36.2 % (ref 36.0–46.0)
HEMOGLOBIN: 12.4 g/dL (ref 12.0–15.0)
MCH: 29.5 pg (ref 26.0–34.0)
MCHC: 34.3 g/dL (ref 30.0–36.0)
MCV: 86 fL (ref 78.0–100.0)
Platelets: 364 10*3/uL (ref 150–400)
RBC: 4.21 MIL/uL (ref 3.87–5.11)
RDW: 13.1 % (ref 11.5–15.5)
WBC: 6.4 10*3/uL (ref 4.0–10.5)

## 2017-09-21 LAB — COMPREHENSIVE METABOLIC PANEL
ALT: 19 U/L (ref 14–54)
ANION GAP: 6 (ref 5–15)
AST: 21 U/L (ref 15–41)
Albumin: 3.8 g/dL (ref 3.5–5.0)
Alkaline Phosphatase: 43 U/L (ref 38–126)
BUN: 13 mg/dL (ref 6–20)
CHLORIDE: 108 mmol/L (ref 101–111)
CO2: 23 mmol/L (ref 22–32)
Calcium: 9 mg/dL (ref 8.9–10.3)
Creatinine, Ser: 0.69 mg/dL (ref 0.44–1.00)
GFR calc non Af Amer: >60 mL/min (ref >60)
Glucose, Bld: 86 mg/dL (ref 65–99)
POTASSIUM: 3.9 mmol/L (ref 3.5–5.1)
Sodium: 137 mmol/L (ref 135–145)
Total Bilirubin: 0.2 mg/dL — ABNORMAL LOW (ref 0.3–1.2)
Total Protein: 7.6 g/dL (ref 6.5–8.1)

## 2017-09-21 NOTE — Pre-Procedure Instructions (Signed)
CMP results 09/21/17 faxed to Dr. Harlow Asa via epic.

## 2017-09-26 ENCOUNTER — Encounter (HOSPITAL_COMMUNITY): Payer: Self-pay | Admitting: Surgery

## 2017-09-26 DIAGNOSIS — K8010 Calculus of gallbladder with chronic cholecystitis without obstruction: Secondary | ICD-10-CM | POA: Diagnosis present

## 2017-09-26 NOTE — H&P (Signed)
General Surgery [facility]  Mary Ray DOB: 05/16/1976 Single / Language: [place] / Race: Black or African American Female   History of Present Illness   The patient is a 42 year old female who presents for evaluation of gall stones.  CC: chronic cholecystitis, cholelithiasis, abd pain  Patient is referred by Abigail Butts, PA, in the [facility] ER, for surgical evaluation and management of symptomatic cholelithiasis and chronic cholecystitis. Patient has had 2 episodes of severe biliary colic in October and in December 2018. Both episodes took her to the emergency department where ultrasound examination was obtained. Her most recent study was on August 25, 2017. This showed a 2.1 cm gallstone lodged in the neck of the gallbladder. Sludge was present. No biliary dilatation. Fatty infiltration of the liver was noted. Patient notes right upper quadrant pain associated with her attacks. These last for several hours. She has mild nausea but no emesis. Patient has a family history of gallbladder disease in her sister who required cholecystectomy. Patient has had previous cesarean section, laparoscopy, and abdominal hysterectomy. She denies any history of hepatobiliary or pancreatic disease. She has no history of jaundice or acholic stools. There is no history of hepatitis or pancreatitis. Patient presents today to discuss cholecystectomy for management of chronic cholecystitis, cholelithiasis, and abdominal pain.   Past Surgical History Cesarean Section - 1  Hysterectomy (not due to cancer) - Complete  Mammoplasty; Reduction  Bilateral. Oral Surgery   Diagnostic Studies History  Colonoscopy  never Mammogram  1-3 years ago Pap Smear  1-5 years ago  Allergies  Clindamycin HCl *ANTI-INFECTIVE AGENTS - MISC.*  Allergies Reconciled   Medication History  Ibuprofen (800MG  Tablet, Oral) Active. Medications Reconciled  Social  History Caffeine use  Carbonated beverages, Tea. No alcohol use  No drug use  Tobacco use  Never smoker.  Family History  Depression  Sister. Hypertension  Mother.  Pregnancy / Birth History Age at menarche  46 years. Gravida  1 Maternal age  27-35 Para  1  Other Problems Oophorectomy   Review of Systems General Not Present- Appetite Loss, Chills, Fatigue, Fever, Night Sweats, Weight Gain and Weight Loss. Skin Not Present- Change in Wart/Mole, Dryness, Hives, Jaundice, New Lesions, Non-Healing Wounds, Rash and Ulcer. HEENT Not Present- Earache, Hearing Loss, Hoarseness, Nose Bleed, Oral Ulcers, Ringing in the Ears, Seasonal Allergies, Sinus Pain, Sore Throat, Visual Disturbances, Wears glasses/contact lenses and Yellow Eyes. Respiratory Not Present- Bloody sputum, Chronic Cough, Difficulty Breathing, Snoring and Wheezing. Breast Not Present- Breast Mass, Breast Pain, Nipple Discharge and Skin Changes. Cardiovascular Not Present- Chest Pain, Difficulty Breathing Lying Down, Leg Cramps, Palpitations, Rapid Heart Rate, Shortness of Breath and Swelling of Extremities. Female Genitourinary Not Present- Frequency, Nocturia, Painful Urination, Pelvic Pain and Urgency. Musculoskeletal Present- Back Pain and Joint Stiffness. Not Present- Joint Pain, Muscle Pain, Muscle Weakness and Swelling of Extremities. Neurological Not Present- Decreased Memory, Fainting, Headaches, Numbness, Seizures, Tingling, Tremor, Trouble walking and Weakness. Psychiatric Not Present- Anxiety, Bipolar, Change in Sleep Pattern, Depression, Fearful and Frequent crying. Endocrine Not Present- Cold Intolerance, Excessive Hunger, Hair Changes, Heat Intolerance, Hot flashes and New Diabetes. Hematology Not Present- Blood Thinners, Easy Bruising, Excessive bleeding, Gland problems, HIV and Persistent Infections.  Vitals Weight: 345.4 lb Height: 67in Body Surface Area: 2.55 m Body Mass Index: 54.1 kg/m   Temp.: 97.60F  Pulse: 90 (Regular)  BP: 136/88 (Sitting, Left Arm, Standard)  Physical Exam   See vital signs recorded above  GENERAL APPEARANCE  Development: normal Nutritional status: normal Gross deformities: none  SKIN Rash, lesions, ulcers: none Induration, erythema: none Nodules: none palpable  EYES Conjunctiva and lids: normal Pupils: equal and reactive Iris: normal bilaterally  EARS, NOSE, MOUTH, THROAT External ears: no lesion or deformity External nose: no lesion or deformity Hearing: grossly normal Lips: no lesion or deformity Dentition: normal for age Oral mucosa: moist  NECK Symmetric: yes Trachea: midline Thyroid: no palpable nodules in the thyroid bed  CHEST Respiratory effort: normal Retraction or accessory muscle use: no Breath sounds: normal bilaterally Rales, rhonchi, wheeze: none  CARDIOVASCULAR Auscultation: regular rhythm, normal rate Murmurs: none Pulses: carotid and radial pulse 2+ palpable Lower extremity edema: none Lower extremity varicosities: none  ABDOMEN Distension: none Masses: none palpable Tenderness: mild RUQ tenderness to deep palpation Hepatosplenomegaly: not present Hernia: not present  MUSCULOSKELETAL Station and gait: normal Digits and nails: no clubbing or cyanosis Muscle strength: grossly normal all extremities Range of motion: grossly normal all extremities Deformity: none  LYMPHATIC Cervical: none palpable Supraclavicular: none palpable  PSYCHIATRIC Oriented to person, place, and time: yes Mood and affect: normal for situation Judgment and insight: appropriate for situation    Assessment & Plan   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 Patient presents with chronic cholecystitis, cholelithiasis, and intermittent right upper quadrant abdominal pain. Patient is  provided with written literature on gallbladder surgery to review at home.  Patient has had at least 2 significant episodes of biliary colic. She has underlying chronic cholecystitis. I have recommended laparoscopic cholecystectomy with intraoperative cholangiography for management. We discussed risk and benefits of the procedure. We discussed the hospital stay to be anticipated. She is provided with written literature on gallbladder surgery to review. We discussed her postoperative recovery and return to work. Patient understands and wishes to proceed with surgery in the near future.  The risks and benefits of the procedure have been discussed at length with the patient. The patient understands the proposed procedure, potential alternative treatments, and the course of recovery to be expected. All of the patient's questions have been answered at this time. The patient wishes to proceed with surgery.  Armandina Gemma, [facility] Office: [phone]

## 2017-09-28 MED ORDER — DEXTROSE 5 % IV SOLN
3.0000 g | INTRAVENOUS | Status: AC
  Administered 2017-09-29: 3 g via INTRAVENOUS
  Filled 2017-09-28: qty 3

## 2017-09-29 ENCOUNTER — Ambulatory Visit (HOSPITAL_COMMUNITY): Payer: BC Managed Care – PPO | Admitting: Anesthesiology

## 2017-09-29 ENCOUNTER — Encounter (HOSPITAL_COMMUNITY): Payer: Self-pay | Admitting: *Deleted

## 2017-09-29 ENCOUNTER — Ambulatory Visit (HOSPITAL_COMMUNITY): Payer: BC Managed Care – PPO

## 2017-09-29 ENCOUNTER — Other Ambulatory Visit: Payer: Self-pay

## 2017-09-29 ENCOUNTER — Observation Stay (HOSPITAL_COMMUNITY)
Admission: RE | Admit: 2017-09-29 | Discharge: 2017-09-30 | Disposition: A | Discharge status: Home / Self Care | Payer: BC Managed Care – PPO | Source: Ambulatory Visit | Attending: Surgery | Admitting: Surgery

## 2017-09-29 ENCOUNTER — Encounter (HOSPITAL_COMMUNITY)
Admission: RE | Disposition: A | Discharge status: Home / Self Care | Payer: Self-pay | Source: Ambulatory Visit | Attending: Surgery

## 2017-09-29 DIAGNOSIS — Z90710 Acquired absence of both cervix and uterus: Secondary | ICD-10-CM | POA: Insufficient documentation

## 2017-09-29 DIAGNOSIS — Z9889 Other specified postprocedural states: Secondary | ICD-10-CM | POA: Insufficient documentation

## 2017-09-29 DIAGNOSIS — K8064 Calculus of gallbladder and bile duct with chronic cholecystitis without obstruction: Principal | ICD-10-CM | POA: Insufficient documentation

## 2017-09-29 DIAGNOSIS — Z818 Family history of other mental and behavioral disorders: Secondary | ICD-10-CM | POA: Diagnosis not present

## 2017-09-29 DIAGNOSIS — K8010 Calculus of gallbladder with chronic cholecystitis without obstruction: Secondary | ICD-10-CM

## 2017-09-29 DIAGNOSIS — Z8249 Family history of ischemic heart disease and other diseases of the circulatory system: Secondary | ICD-10-CM | POA: Insufficient documentation

## 2017-09-29 DIAGNOSIS — Z881 Allergy status to other antibiotic agents status: Secondary | ICD-10-CM | POA: Diagnosis not present

## 2017-09-29 DIAGNOSIS — Z419 Encounter for procedure for purposes other than remedying health state, unspecified: Secondary | ICD-10-CM

## 2017-09-29 DIAGNOSIS — Z888 Allergy status to other drugs, medicaments and biological substances status: Secondary | ICD-10-CM | POA: Diagnosis not present

## 2017-09-29 DIAGNOSIS — Z6841 Body Mass Index (BMI) 40.0 and over, adult: Secondary | ICD-10-CM | POA: Diagnosis not present

## 2017-09-29 DIAGNOSIS — Z886 Allergy status to analgesic agent status: Secondary | ICD-10-CM | POA: Insufficient documentation

## 2017-09-29 HISTORY — PX: CHOLECYSTECTOMY: SHX55

## 2017-09-29 SURGERY — LAPAROSCOPIC CHOLECYSTECTOMY WITH INTRAOPERATIVE CHOLANGIOGRAM
Anesthesia: General

## 2017-09-29 MED ORDER — BUPIVACAINE-EPINEPHRINE 0.5% -1:200000 IJ SOLN
INTRAMUSCULAR | Status: DC | PRN
  Administered 2017-09-29: 20 mL

## 2017-09-29 MED ORDER — KETOROLAC TROMETHAMINE 30 MG/ML IJ SOLN
30.0000 mg | Freq: Once | INTRAMUSCULAR | Status: DC | PRN
  Administered 2017-09-29: 30 mg via INTRAVENOUS

## 2017-09-29 MED ORDER — BUPIVACAINE-EPINEPHRINE (PF) 0.5% -1:200000 IJ SOLN
INTRAMUSCULAR | Status: AC
  Filled 2017-09-29: qty 30

## 2017-09-29 MED ORDER — LIDOCAINE 2% (20 MG/ML) 5 ML SYRINGE
INTRAMUSCULAR | Status: DC | PRN
  Administered 2017-09-29: 60 mg via INTRAVENOUS

## 2017-09-29 MED ORDER — FENTANYL CITRATE (PF) 100 MCG/2ML IJ SOLN
INTRAMUSCULAR | Status: AC
  Filled 2017-09-29: qty 2

## 2017-09-29 MED ORDER — KETOROLAC TROMETHAMINE 30 MG/ML IJ SOLN
INTRAMUSCULAR | Status: AC
  Filled 2017-09-29: qty 1

## 2017-09-29 MED ORDER — SUCCINYLCHOLINE CHLORIDE 20 MG/ML IJ SOLN
INTRAMUSCULAR | Status: DC | PRN
  Administered 2017-09-29: 140 mg via INTRAVENOUS

## 2017-09-29 MED ORDER — PROPOFOL 10 MG/ML IV BOLUS
INTRAVENOUS | Status: AC
  Filled 2017-09-29: qty 20

## 2017-09-29 MED ORDER — ONDANSETRON 4 MG PO TBDP: 4.0000 mg | ORAL_TABLET | Freq: Four times a day (QID) | ORAL | Status: DC | PRN

## 2017-09-29 MED ORDER — ONDANSETRON HCL 4 MG/2ML IJ SOLN: 4.0000 mg | Freq: Four times a day (QID) | INTRAMUSCULAR | Status: DC | PRN

## 2017-09-29 MED ORDER — DEXAMETHASONE SODIUM PHOSPHATE 10 MG/ML IJ SOLN
INTRAMUSCULAR | Status: DC | PRN
  Administered 2017-09-29: 5 mg via INTRAVENOUS

## 2017-09-29 MED ORDER — ONDANSETRON HCL 4 MG/2ML IJ SOLN
INTRAMUSCULAR | Status: AC
  Filled 2017-09-29: qty 2

## 2017-09-29 MED ORDER — ROCURONIUM BROMIDE 10 MG/ML (PF) SYRINGE
PREFILLED_SYRINGE | INTRAVENOUS | Status: DC | PRN
  Administered 2017-09-29: 10 mg via INTRAVENOUS
  Administered 2017-09-29: 40 mg via INTRAVENOUS

## 2017-09-29 MED ORDER — IOPAMIDOL (ISOVUE-300) INJECTION 61%
INTRAVENOUS | Status: AC
  Filled 2017-09-29: qty 50

## 2017-09-29 MED ORDER — ACETAMINOPHEN 325 MG PO TABS: 650.0000 mg | ORAL_TABLET | Freq: Four times a day (QID) | ORAL | Status: DC | PRN

## 2017-09-29 MED ORDER — FENTANYL CITRATE (PF) 100 MCG/2ML IJ SOLN
INTRAMUSCULAR | Status: DC | PRN
  Administered 2017-09-29 (×3): 50 ug via INTRAVENOUS
  Administered 2017-09-29: 100 ug via INTRAVENOUS
  Administered 2017-09-29: 50 ug via INTRAVENOUS

## 2017-09-29 MED ORDER — HYDROMORPHONE HCL 1 MG/ML IJ SOLN
INTRAMUSCULAR | Status: AC
  Administered 2017-09-29: 0.5 mg via INTRAVENOUS
  Filled 2017-09-29: qty 1

## 2017-09-29 MED ORDER — HYDROCODONE-ACETAMINOPHEN 5-325 MG PO TABS
1.0000 | ORAL_TABLET | ORAL | Status: DC | PRN
  Administered 2017-09-29 – 2017-09-30 (×3): 2 via ORAL
  Filled 2017-09-29 (×3): qty 2

## 2017-09-29 MED ORDER — ONDANSETRON HCL 4 MG/2ML IJ SOLN
INTRAMUSCULAR | Status: DC | PRN
  Administered 2017-09-29: 4 mg via INTRAVENOUS

## 2017-09-29 MED ORDER — TRAMADOL HCL 50 MG PO TABS
50.0000 mg | ORAL_TABLET | Freq: Four times a day (QID) | ORAL | Status: DC | PRN
  Filled 2017-09-29: qty 1

## 2017-09-29 MED ORDER — MIDAZOLAM HCL 2 MG/2ML IJ SOLN
INTRAMUSCULAR | Status: AC
  Filled 2017-09-29: qty 2

## 2017-09-29 MED ORDER — LIDOCAINE 2% (20 MG/ML) 5 ML SYRINGE
INTRAMUSCULAR | Status: AC
Start: 2017-09-29 — End: ?
  Filled 2017-09-29: qty 5

## 2017-09-29 MED ORDER — ACETAMINOPHEN 650 MG RE SUPP: 650.0000 mg | Freq: Four times a day (QID) | RECTAL | Status: DC | PRN

## 2017-09-29 MED ORDER — CHLORHEXIDINE GLUCONATE CLOTH 2 % EX PADS: 6.0000 | MEDICATED_PAD | Freq: Once | CUTANEOUS | Status: DC

## 2017-09-29 MED ORDER — LACTATED RINGERS IR SOLN
Status: DC | PRN
  Administered 2017-09-29: 1000 mL

## 2017-09-29 MED ORDER — KCL IN DEXTROSE-NACL 20-5-0.45 MEQ/L-%-% IV SOLN
INTRAVENOUS | Status: DC
  Administered 2017-09-29: 15:00:00 via INTRAVENOUS
  Filled 2017-09-29 (×2): qty 1000

## 2017-09-29 MED ORDER — 0.9 % SODIUM CHLORIDE (POUR BTL) OPTIME
TOPICAL | Status: DC | PRN
  Administered 2017-09-29: 1000 mL

## 2017-09-29 MED ORDER — SODIUM CHLORIDE 0.9 % IJ SOLN
INTRAMUSCULAR | Status: DC | PRN
  Administered 2017-09-29: 10 mL

## 2017-09-29 MED ORDER — PROMETHAZINE HCL 25 MG/ML IJ SOLN: 6.2500 mg | INTRAMUSCULAR | Status: DC | PRN

## 2017-09-29 MED ORDER — FENTANYL CITRATE (PF) 250 MCG/5ML IJ SOLN
INTRAMUSCULAR | Status: AC
  Filled 2017-09-29: qty 5

## 2017-09-29 MED ORDER — SUGAMMADEX SODIUM 500 MG/5ML IV SOLN
INTRAVENOUS | Status: AC
  Filled 2017-09-29: qty 5

## 2017-09-29 MED ORDER — IOPAMIDOL (ISOVUE-300) INJECTION 61%
INTRAVENOUS | Status: DC | PRN
  Administered 2017-09-29: 10 mL

## 2017-09-29 MED ORDER — PROPOFOL 10 MG/ML IV BOLUS
INTRAVENOUS | Status: DC | PRN
  Administered 2017-09-29: 200 mg via INTRAVENOUS

## 2017-09-29 MED ORDER — HYDROMORPHONE HCL 1 MG/ML IJ SOLN
1.0000 mg | INTRAMUSCULAR | Status: DC | PRN
  Administered 2017-09-29 – 2017-09-30 (×3): 1 mg via INTRAVENOUS
  Filled 2017-09-29 (×3): qty 1

## 2017-09-29 MED ORDER — MIDAZOLAM HCL 5 MG/5ML IJ SOLN
INTRAMUSCULAR | Status: DC | PRN
  Administered 2017-09-29: 2 mg via INTRAVENOUS

## 2017-09-29 MED ORDER — SUGAMMADEX SODIUM 200 MG/2ML IV SOLN
INTRAVENOUS | Status: DC | PRN
  Administered 2017-09-29: 300 mg via INTRAVENOUS

## 2017-09-29 MED ORDER — TRAMADOL HCL 50 MG PO TABS: 50.0000 mg | ORAL_TABLET | Freq: Four times a day (QID) | ORAL | Status: DC | PRN

## 2017-09-29 MED ORDER — HYDROMORPHONE HCL 1 MG/ML IJ SOLN
0.2500 mg | INTRAMUSCULAR | Status: DC | PRN
  Administered 2017-09-29 (×2): 0.5 mg via INTRAVENOUS

## 2017-09-29 MED ORDER — LACTATED RINGERS IV SOLN
INTRAVENOUS | Status: DC
  Administered 2017-09-29 (×2): via INTRAVENOUS

## 2017-09-29 MED ORDER — OXYCODONE HCL 5 MG PO TABS: 5.0000 mg | ORAL_TABLET | ORAL | 0 refills | Status: DC | PRN

## 2017-09-29 SURGICAL SUPPLY — 31 items
APPLIER CLIP ROT 10 11.4 M/L (STAPLE) ×2
BAG SPEC RTRVL LRG 6X4 10 (ENDOMECHANICALS) ×1
CABLE HIGH FREQUENCY MONO STRZ (ELECTRODE) ×2 IMPLANT
CHLORAPREP W/TINT 26ML (MISCELLANEOUS) ×4 IMPLANT
CLIP APPLIE ROT 10 11.4 M/L (STAPLE) ×1 IMPLANT
COVER MAYO STAND STRL (DRAPES) ×2 IMPLANT
COVER SURGICAL LIGHT HANDLE (MISCELLANEOUS) ×2 IMPLANT
DECANTER SPIKE VIAL GLASS SM (MISCELLANEOUS) ×2 IMPLANT
DRAPE C-ARM 42X120 X-RAY (DRAPES) ×2 IMPLANT
ELECT REM PT RETURN 15FT ADLT (MISCELLANEOUS) ×2 IMPLANT
GAUZE SPONGE 2X2 8PLY STRL LF (GAUZE/BANDAGES/DRESSINGS) ×1 IMPLANT
GLOVE SURG ORTHO 8.0 STRL STRW (GLOVE) ×2 IMPLANT
GOWN STRL REUS W/TWL XL LVL3 (GOWN DISPOSABLE) ×4 IMPLANT
HEMOSTAT SURGICEL 4X8 (HEMOSTASIS) IMPLANT
KIT BASIN OR (CUSTOM PROCEDURE TRAY) ×2 IMPLANT
POUCH SPECIMEN RETRIEVAL 10MM (ENDOMECHANICALS) ×2 IMPLANT
SCISSORS METZENBAUM CVD 33 (INSTRUMENTS) ×2 IMPLANT
SET CHOLANGIOGRAPH MIX (MISCELLANEOUS) ×2 IMPLANT
SET IRRIG TUBING LAPAROSCOPIC (IRRIGATION / IRRIGATOR) ×2 IMPLANT
SLEEVE XCEL OPT CAN 5 100 (ENDOMECHANICALS) ×2 IMPLANT
SPONGE GAUZE 2X2 STER 10/PKG (GAUZE/BANDAGES/DRESSINGS) ×1
STRIP CLOSURE SKIN 1/2X4 (GAUZE/BANDAGES/DRESSINGS) ×2 IMPLANT
SUT MNCRL AB 4-0 PS2 18 (SUTURE) ×2 IMPLANT
TAPE CLOTH SURG 4X10 WHT LF (GAUZE/BANDAGES/DRESSINGS) ×2 IMPLANT
TOWEL OR 17X26 10 PK STRL BLUE (TOWEL DISPOSABLE) ×2 IMPLANT
TOWEL OR NON WOVEN STRL DISP B (DISPOSABLE) ×2 IMPLANT
TRAY LAPAROSCOPIC (CUSTOM PROCEDURE TRAY) ×2 IMPLANT
TROCAR BLADELESS OPT 5 100 (ENDOMECHANICALS) ×2 IMPLANT
TROCAR XCEL BLUNT TIP 100MML (ENDOMECHANICALS) ×2 IMPLANT
TROCAR XCEL NON-BLD 11X100MML (ENDOMECHANICALS) ×2 IMPLANT
TUBING INSUF HEATED (TUBING) IMPLANT

## 2017-09-29 NOTE — Anesthesia Preprocedure Evaluation (Addendum)
Anesthesia Evaluation  Patient identified by MRN, date of birth, ID band Patient awake    Reviewed: Allergy & Precautions, NPO status , Patient's Chart, lab work & pertinent test results  Airway Mallampati: II  TM Distance: >3 FB Neck ROM: Full    Dental no notable dental hx.    Pulmonary neg pulmonary ROS,    Pulmonary exam normal breath sounds clear to auscultation       Cardiovascular negative cardio ROS Normal cardiovascular exam Rhythm:Regular Rate:Normal     Neuro/Psych negative neurological ROS  negative psych ROS   GI/Hepatic negative GI ROS, Neg liver ROS,   Endo/Other  Morbid obesity  Renal/GU negative Renal ROS  negative genitourinary   Musculoskeletal negative musculoskeletal ROS (+)   Abdominal   Peds negative pediatric ROS (+)  Hematology negative hematology ROS (+)   Anesthesia Other Findings   Reproductive/Obstetrics negative OB ROS                            Anesthesia Physical Anesthesia Plan  ASA: II  Anesthesia Plan: General   Post-op Pain Management:    Induction: Intravenous  PONV Risk Score and Plan: 2 and Ondansetron, Dexamethasone and Treatment may vary due to age or medical condition  Airway Management Planned: Oral ETT  Additional Equipment:   Intra-op Plan:   Post-operative Plan: Extubation in OR  Informed Consent: I have reviewed the patients History and Physical, chart, labs and discussed the procedure including the risks, benefits and alternatives for the proposed anesthesia with the patient or authorized representative who has indicated his/her understanding and acceptance.   Dental advisory given  Plan Discussed with: CRNA and Surgeon  Anesthesia Plan Comments:         Anesthesia Quick Evaluation

## 2017-09-29 NOTE — Discharge Instructions (Signed)
°  [facility] ° °LAPAROSCOPIC SURGERY:  POST-OP INSTRUCTIONS ° °Always review your discharge instruction sheet given to you by the facility where your surgery was performed. ° °A prescription for pain medication may be given to you upon discharge.  Take your pain medication as prescribed.  If narcotic pain medicine is not needed, then you may take acetaminophen (Tylenol) or ibuprofen (Advil) as needed. ° °Take your usually prescribed medications unless otherwise directed. ° °If you need a refill on your pain medication, please contact your pharmacy.  They will contact our office to request authorization. Prescriptions will not be filled after 5 P.M. or on weekends. ° °You should follow a light diet the first few days after arrival home, such as soup and crackers or toast.  Be sure to include plenty of fluids daily. ° °Most patients will experience some swelling and bruising in the area of the incisions.  Ice packs will help.  Swelling and bruising can take several days to resolve.  ° °It is common to experience some constipation after surgery.  Increasing fluid intake and taking a stool softener (such as Colace) will usually help or prevent this problem from occurring.  A mild laxative (Milk of Magnesia or Miralax) should be taken according to package instructions if there has been no bowel movement after 48 hours. ° °You will have steri-strips and a gauze dressing over your incisions.  You may remove the gauze bandage on the second day after surgery, and you may shower at that time.  Leave your steri-strips (small skin tapes) in place directly over the incision.  These strips should remain on the skin for 5-7 days and then be removed.  You may get them wet in the shower and pat them dry. ° °Any sutures or staples will be removed at the office during your follow-up visit. ° °ACTIVITIES:  You may resume regular (light) daily activities beginning the next day - such as daily self-care, walking,  climbing stairs - gradually increasing activities as tolerated.  You may have sexual intercourse when it is comfortable.  Refrain from any heavy lifting or straining until approved by your doctor. ° °You may drive when you are no longer taking prescription pain medication, you can comfortably wear a seatbelt, and you can safely maneuver your car and apply brakes. ° °You should see your doctor in the office for a follow-up appointment approximately 2-3 weeks after your surgery.  Make sure that you call for this appointment within a day or two after you arrive home to insure a convenient appointment time. ° °WHEN TO CALL YOUR DOCTOR: °1. Fever over 101.0 °2. Inability to urinate °3. Continued bleeding from incision °4. Increased pain, redness, or drainage from the incision °5. Increasing abdominal pain ° °The clinic staff is available to answer your questions during regular business hours.  Please don’t hesitate to call and ask to speak to one of the nurses for clinical concerns.  If you have a medical emergency, go to the nearest emergency room or call 911.  A surgeon from [facility] is always on call for the hospital. ° °Valecia Beske M. Deola Rewis, MD, FACS °[facility] °Office: [phone] °Toll Free:  [phone] °FAX [phone] ° °Website: [email or web address] °

## 2017-09-29 NOTE — Op Note (Signed)
Procedure Note  Pre-operative Diagnosis:  Chronic cholecystitis, cholelithiasis  Post-operative Diagnosis:  same  Surgeon:  Armandina Gemma, MD  Assistant:  none   Procedure:  Laparoscopic cholecystectomy with intra-operative cholangiography  Anesthesia:  General  Estimated Blood Loss:  minimal  Drains: none         Specimen: Gallbladder to pathology  Indications:  Patient is referred by Abigail Butts, PA, in the [facility] ER, for surgical evaluation and management of symptomatic cholelithiasis and chronic cholecystitis. Patient has had 2 episodes of severe biliary colic in October and in December 2018. Both episodes took her to the emergency department where ultrasound examination was obtained. Her most recent study was on August 25, 2017. This showed a 2.1 cm gallstone lodged in the neck of the gallbladder. Sludge was present. No biliary dilatation. Fatty infiltration of the liver was noted. Patient notes right upper quadrant pain associated with her attacks. These last for several hours. She has mild nausea but no emesis. Patient has a family history of gallbladder disease in her sister who required cholecystectomy. Patient has had previous cesarean section, laparoscopy, and abdominal hysterectomy. She denies any history of hepatobiliary or pancreatic disease. She has no history of jaundice or acholic stools. There is no history of hepatitis or pancreatitis. Patient presents today to discuss cholecystectomy for management of chronic cholecystitis, cholelithiasis, and abdominal pain.  Procedure Details:  The patient was seen in the pre-op holding area. The risks, benefits, complications, treatment options, and expected outcomes were previously discussed with the patient. The patient agreed with the proposed plan and has signed the informed consent form.  The patient was brought to the Operating Room, identified as [place] and the procedure verified as laparoscopic  cholecystectomy with intraoperative cholangiography. A "time out" was completed and the above information confirmed.  The patient was placed in the supine position. Following induction of general anesthesia, the abdomen was prepped and draped in the usual aseptic fashion.  An incision was made in the skin near the umbilicus. The midline fascia was incised and the peritoneal cavity was entered and a Hasson canula was introduced under direct vision.  The Hasson canula was secured with a 0-Vicryl pursestring suture. Pneumoperitoneum was established with carbon dioxide. Additional trocars were introduced under direct vision along the right costal margin in the midline, mid-clavicular line, and anterior axillary line.   The gallbladder was identified and the fundus grasped and retracted cephalad. Adhesions were taken down bluntly and the electrocautery was utilized as needed, taking care not to injure any adjacent structures. The infundibulum was grasped and retracted laterally, exposing the peritoneum overlying the triangle of Calot. The peritoneum was incised and structures exposed with blunt dissection. The cystic duct was clearly identified, bluntly dissected circumferentially, and clipped at the neck of the gallbladder.  An incision was made in the cystic duct and the cholangiogram catheter introduced. The catheter was secured using an ligaclip.  Real-time cholangiography was performed using C-arm fluoroscopy.  There was rapid filling of a normal caliber common bile duct.  There was reflux of contrast into the left and right hepatic ductal systems.  There was free flow distally into the duodenum without filling defect or obstruction.  The catheter was removed from the peritoneal cavity.  The cystic duct was then ligated with surgical clips and divided. The cystic artery was identified, dissected circumferentially, ligated with ligaclips, and divided.  The gallbladder was dissected away from the liver bed  using the electrocautery for hemostasis. The gallbladder was  completely removed from the liver and placed into an endocatch bag. The gallbladder was removed in the endocatch bag through the umbilical port site and submitted to pathology for review.  The right upper quadrant was irrigated and the gallbladder bed was inspected. Hemostasis was achieved with the electrocautery.  Pneumoperitoneum was released after viewing removal of the trocars with good hemostasis noted. The umbilical wound was irrigated and the fascia was then closed with the pursestring suture.  Local anesthetic was infiltrated at all port sites. The skin incisions were closed with 4-0 Monocril subcuticular sutures and steri-strips and dressings were applied.  Instrument, sponge, and needle counts were correct at the conclusion of the case.  The patient was awakened from anesthesia and brought to the recovery room in stable condition.  The patient tolerated the procedure well.   Armandina Gemma, MD [facility] Office: [phone]

## 2017-09-29 NOTE — Transfer of Care (Signed)
Immediate Anesthesia Transfer of Care Note  Patient: Sandhya N Khader  Procedure(s) Performed: LAPAROSCOPIC CHOLECYSTECTOMY WITH INTRAOPERATIVE CHOLANGIOGRAM ([name] )  Patient Location: PACU  Anesthesia Type:General  Level of Consciousness: sedated, patient cooperative and responds to stimulation  Airway & Oxygen Therapy: Patient Spontanous Breathing and Patient connected to face mask oxygen  Post-op Assessment: Report given to RN and Post -op Vital signs reviewed and stable  Post vital signs: Reviewed and stable  Last Vitals:  Vitals:   09/29/17 0756 09/29/17 1145  BP: (!) 154/72 (!) (P) 165/79  Pulse: 68   Resp: 16 (!) (P) 26  Temp: 37.3 C (P) 36.8 C  SpO2: 100%     Last Pain:  Vitals:   09/29/17 0756  TempSrc: Oral         Complications: No apparent anesthesia complications

## 2017-09-29 NOTE — Anesthesia Procedure Notes (Signed)
Procedure Name: Intubation Performed by: Gean Maidens, CRNA Pre-anesthesia Checklist: Patient identified, Emergency Drugs available, Suction available, Patient being monitored and Timeout performed Patient Re-evaluated:Patient Re-evaluated prior to induction Oxygen Delivery Method: Circle system utilized Preoxygenation: Pre-oxygenation with 100% oxygen Induction Type: IV induction Ventilation: Mask ventilation without difficulty Laryngoscope Size: Mac and 4 Grade View: Grade I Tube type: Oral Tube size: 7.0 mm Number of attempts: 1 Airway Equipment and Method: Stylet Placement Confirmation: ETT inserted through vocal cords under direct vision,  positive ETCO2,  CO2 detector and breath sounds checked- equal and bilateral Secured at: 21 cm Tube secured with: Tape Dental Injury: Teeth and Oropharynx as per pre-operative assessment

## 2017-09-29 NOTE — Interval H&P Note (Signed)
History and Physical Interval Note:  09/29/2017 10:14 AM  Mary Ray  has presented today for surgery, with the diagnosis of CHRONIC CHOLECYSTITIS.  The various methods of treatment have been discussed with the patient and family. After consideration of risks, benefits and other options for treatment, the patient has consented to    Procedure(s): LAPAROSCOPIC CHOLECYSTECTOMY WITH INTRAOPERATIVE CHOLANGIOGRAM ([name]) as a surgical intervention .    The patient's history has been reviewed, patient examined, no change in status, stable for surgery.  I have reviewed the patient's chart and labs.  Questions were answered to the patient's satisfaction.    Armandina Gemma, [facility] Office: [place]

## 2017-09-30 DIAGNOSIS — K8064 Calculus of gallbladder and bile duct with chronic cholecystitis without obstruction: Secondary | ICD-10-CM | POA: Diagnosis not present

## 2017-09-30 MED ORDER — OXYCODONE HCL 5 MG PO TABS: 5.0000 mg | ORAL_TABLET | Freq: Four times a day (QID) | ORAL | 0 refills | Status: DC | PRN

## 2017-09-30 NOTE — Progress Notes (Addendum)
Pt was discharged home today. Instructions were reviewed with patient, prescription was verified and given to patient and questions were answered. Pt was taken to main entrance via wheelchair by NT.

## 2017-09-30 NOTE — Discharge Summary (Signed)
Physician Discharge Summary  Patient ID: Mary Ray MRN: [id number] DOB/AGE: 01-06-1976 42 y.o.  Admit date: 09/29/2017 Discharge date: 09/30/2017  Admission Diagnoses: Patient Active Problem List   Diagnosis Date Noted  . Cholelithiasis with chronic cholecystitis 09/26/2017  . Menorrhagia 10/19/2015  . Threatened preterm labor, antepartum 06/18/2011    Discharge Diagnoses:  Principal Problem:   Cholelithiasis with chronic cholecystitis   Discharged Condition: stable  Hospital Course: Pt was admitted to the floor following a laparoscopic cholecystectomy with normal IOC 09/29/2016 by Dr. Harlow Asa.  She did well overnight without nausea.  She is passing some gas.  She is ambulatory and able to void independently.  She is tolerating oral pain medication.  She desires to go home.  She is not having fever/chills.    Consults: None  Significant Diagnostic Studies: labs: none  Treatments: surgery: see above.   Discharge Exam: Blood pressure 129/65, pulse 84, temperature 98 F (36.7 C), temperature source Oral, resp. rate 18, height 5\' 8"  (1.727 m), weight (!) 149.7 kg (330 lb), last menstrual period 09/28/2015, SpO2 98 %. General appearance: alert, cooperative and no distress Cardio: regular rate and rhythm GI: soft, non distended, approp tender.  dressings c/d/i. Extremities: extremities normal, atraumatic, no cyanosis or edema  Disposition: 01-Home or Self Care  Discharge Instructions    Call MD for:   Complete by:  As directed    Jaundice or discolored urine.   Call MD for:  persistant dizziness or light-headedness   Complete by:  As directed    Call MD for:  persistant nausea and vomiting   Complete by:  As directed    Call MD for:  redness, tenderness, or signs of infection (pain, swelling, redness, odor or green/yellow discharge around incision site)   Complete by:  As directed    Call MD for:  severe uncontrolled pain   Complete by:  As directed    Call MD for:   temperature >100.4   Complete by:  As directed    Change dressing (specify)   Complete by:  As directed    Remove dressings 2/3 before shower.   Diet - low sodium heart healthy   Complete by:  As directed    Increase activity slowly   Complete by:  As directed      Allergies as of 09/30/2017      Reactions   Vioxx [rofecoxib] Hives, Rash   Patient reported it was years ago.  Pt. Says she can take ibuprofen   Clindamycin/lincomycin Swelling   Facial swelling; allergic to anything ending in 'mycin'      Medication List    TAKE these medications   EPINEPHrine 0.3 mg/0.3 mL Soaj injection Commonly known as:  EPIPEN Inject 0.3 mLs (0.3 mg total) into the muscle once.   ibuprofen 800 MG tablet Commonly known as:  ADVIL,MOTRIN Take 800 mg by mouth daily as needed for pain.   oxyCODONE 5 MG immediate release tablet Commonly known as:  Oxy IR/ROXICODONE Take 1-2 tablets (5-10 mg total) by mouth every 6 (six) hours as needed for moderate pain.            Discharge Care Instructions  (From admission, onward)        Start     Ordered   09/30/17 0000  Change dressing (specify)    Comments:  Remove dressings 2/3 before shower.   09/30/17 1050     Follow-up Information    Armandina Gemma, MD. Schedule an appointment as soon  as possible for a visit in 3 week(s).   Specialty:  General Surgery Contact information: [address] [place] [address] [phone]           Signed: Stark Klein 09/30/2017, 10:50 AM

## 2017-10-02 ENCOUNTER — Encounter (HOSPITAL_COMMUNITY): Payer: Self-pay | Admitting: Surgery

## 2017-10-09 NOTE — Anesthesia Postprocedure Evaluation (Signed)
Anesthesia Post Note  Patient: Mary Ray  Procedure(s) Performed: LAPAROSCOPIC CHOLECYSTECTOMY WITH INTRAOPERATIVE CHOLANGIOGRAM ([name] )     Patient location during evaluation: PACU Anesthesia Type: General Level of consciousness: awake and alert Pain management: pain level controlled Vital Signs Assessment: post-procedure vital signs reviewed and stable Respiratory status: spontaneous breathing, nonlabored ventilation, respiratory function stable and patient connected to nasal cannula oxygen Cardiovascular status: blood pressure returned to baseline and stable Postop Assessment: no apparent nausea or vomiting Anesthetic complications: no    Last Vitals:  Vitals:   09/30/17 0246 09/30/17 1015  BP: 132/81 129/65  Pulse: 78 84  Resp: 18 18  Temp: 36.7 C 36.7 C  SpO2: 100% 98%    Last Pain:  Vitals:   09/30/17 1015  TempSrc: Oral  PainSc:                  Kody Brandl S

## 2018-04-26 ENCOUNTER — Other Ambulatory Visit (HOSPITAL_COMMUNITY): Payer: Self-pay | Admitting: Geriatric Medicine

## 2018-04-26 ENCOUNTER — Ambulatory Visit (HOSPITAL_COMMUNITY)
Admission: RE | Admit: 2018-04-26 | Discharge: 2018-04-26 | Disposition: A | Discharge status: Home / Self Care | Payer: BC Managed Care – PPO | Source: Ambulatory Visit | Attending: Internal Medicine | Admitting: Internal Medicine

## 2018-04-26 DIAGNOSIS — M79661 Pain in right lower leg: Secondary | ICD-10-CM | POA: Diagnosis not present

## 2018-04-26 DIAGNOSIS — R52 Pain, unspecified: Secondary | ICD-10-CM

## 2018-04-26 NOTE — Progress Notes (Addendum)
RLE venous duplex prelim: negative for DVT.  Landry Mellow, RDMS, RVT   Called results to Dr. Felipa Eth voicemail.

## 2018-05-16 ENCOUNTER — Other Ambulatory Visit: Payer: Self-pay | Admitting: Sports Medicine

## 2018-05-16 DIAGNOSIS — M25561 Pain in right knee: Secondary | ICD-10-CM

## 2018-05-26 ENCOUNTER — Ambulatory Visit
Admission: RE | Admit: 2018-05-26 | Discharge: 2018-05-26 | Disposition: A | Discharge status: Home / Self Care | Payer: BC Managed Care – PPO | Source: Ambulatory Visit | Attending: Sports Medicine | Admitting: Sports Medicine

## 2018-05-26 DIAGNOSIS — M25561 Pain in right knee: Secondary | ICD-10-CM

## 2018-06-11 ENCOUNTER — Other Ambulatory Visit: Payer: Self-pay | Admitting: Obstetrics and Gynecology

## 2018-06-11 DIAGNOSIS — R928 Other abnormal and inconclusive findings on diagnostic imaging of breast: Secondary | ICD-10-CM

## 2018-06-14 NOTE — H&P (Signed)
[facility]  [address] [place], [place] [address] [phone]   A Division of [facility]  RE: Mary Ray, Mary Ray   [id number]      DOB: 04/09/1976  05-30-18  REASON FOR VISIT: Right knee injury.  HPI:   She started developing pain and popping over the last several months in her right knee. She has an MRI which demonstrates a complex posterior medial meniscus tear. 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 She has had an injection which helped for several weeks but she continues to have painful popping in her medial knee.   EXAMINATION: She is an obese female in no apparent distress. The right lower extremity shows tenderness at the posterior medial joint line. Neurovascularly intact.   IMAGES: MRI as noted above.  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 We will do a lateral chondroplasty and posterior medial partial meniscectomy. I will do a 40 mg Depo Medrol injection today.   Ernesta Amble.  Percell Miller, M.D.  Electronically verified by Ernesta Amble. Percell Miller, M.D. TDM:jgc D:   06-01-18 T:   06-01-18  cc:  Seward Carol, MD fax [phone]

## 2018-06-15 ENCOUNTER — Other Ambulatory Visit: Payer: Self-pay | Admitting: Obstetrics and Gynecology

## 2018-06-15 ENCOUNTER — Ambulatory Visit: Admission: RE | Admit: 2018-06-15 | Payer: BC Managed Care – PPO | Source: Ambulatory Visit

## 2018-06-15 ENCOUNTER — Ambulatory Visit
Admission: RE | Admit: 2018-06-15 | Discharge: 2018-06-15 | Disposition: A | Discharge status: Home / Self Care | Payer: BC Managed Care – PPO | Source: Ambulatory Visit | Attending: Obstetrics and Gynecology | Admitting: Obstetrics and Gynecology

## 2018-06-15 DIAGNOSIS — R928 Other abnormal and inconclusive findings on diagnostic imaging of breast: Secondary | ICD-10-CM

## 2018-07-24 NOTE — Patient Instructions (Addendum)
Mary Ray  07/24/2018   Your procedure is scheduled on: Tuesday 08/14/2018  Report to [facility] Main  Entrance              Report to admitting at 1100  AM    Call this number if you have problems the morning of surgery [phone]    Remember: Do not eat food :After Midnight.  May have clear liquids from midnight up until 0700 am then nothing until after surgery!    CLEAR LIQUID DIET   Foods Allowed                                                                     Foods Excluded  Coffee and tea, regular and decaf                             liquids that you cannot  Plain Jell-O in any flavor                                             see through such as: Fruit ices (not with fruit pulp)                                     milk, soups, orange juice  Iced Popsicles                                    All solid food Carbonated beverages, regular and diet                                    Cranberry, grape and apple juices Sports drinks like Gatorade Lightly seasoned clear broth or consume(fat free) Sugar, honey syrup  Sample Menu Breakfast                                Lunch                                     Supper Cranberry juice                    Beef broth                            Chicken broth Jell-O                                     Grape juice  Apple juice Coffee or tea                        Jell-O                                      Popsicle                                                Coffee or tea                        Coffee or tea  _____________________________________________________________________                BRUSH YOUR TEETH MORNING OF SURGERY AND RINSE YOUR MOUTH OUT, NO CHEWING GUM CANDY OR MINTS.     Take these medicines the morning of surgery with A SIP OF WATER: none                                You may not have any metal on your body including hair pins and               piercings  Do not wear jewelry, make-up, lotions, powders or perfumes, deodorant             Do not wear nail polish.  Do not shave  48 hours prior to surgery.               Do not bring valuables to the hospital. [place].  Contacts, dentures or bridgework may not be worn into surgery.  Leave suitcase in the car. After surgery it may be brought to your room.     Patients discharged the day of surgery will not be allowed to drive home.  Name and phone number of your driver: niece-Mya                         Please read over the following fact sheets you were given: _____________________________________________________________________             [facility] - Preparing for Surgery Before surgery, you can play an important role.  Because skin is not sterile, your skin needs to be as free of germs as possible.  You can reduce the number of germs on your skin by washing with CHG (chlorahexidine gluconate) soap before surgery.  CHG is an antiseptic cleaner which kills germs and bonds with the skin to continue killing germs even after washing. Please DO NOT use if you have an allergy to CHG or antibacterial soaps.  If your skin becomes reddened/irritated stop using the CHG and inform your nurse when you arrive at Short Stay. Do not shave (including legs and underarms) for at least 48 hours prior to the first CHG shower.  You may shave your face/neck. Please follow these instructions carefully:  1.  Shower with CHG Soap the night before surgery and the  morning of Surgery.  2.  If you choose to wash your hair, wash your hair first as usual  with your  normal  shampoo.  3.  After you shampoo, rinse your hair and body thoroughly to remove the  shampoo.                           4.  Use CHG as you would any other liquid soap.  You can apply chg directly  to the skin and wash                       Gently with a scrungie or clean washcloth.  5.  Apply  the CHG Soap to your body ONLY FROM THE NECK DOWN.   Do not use on face/ open                           Wound or open sores. Avoid contact with eyes, ears mouth and genitals (private parts).                       Wash face,  Genitals (private parts) with your normal soap.             6.  Wash thoroughly, paying special attention to the area where your surgery  will be performed.  7.  Thoroughly rinse your body with warm water from the neck down.  8.  DO NOT shower/wash with your normal soap after using and rinsing off  the CHG Soap.                9.  Pat yourself dry with a clean towel.            10.  Wear clean pajamas.            11.  Place clean sheets on your bed the night of your first shower and do not  sleep with pets. Day of Surgery : Do not apply any lotions/deodorants the morning of surgery.  Please wear clean clothes to the hospital/surgery center.  FAILURE TO FOLLOW THESE INSTRUCTIONS MAY RESULT IN THE CANCELLATION OF YOUR SURGERY PATIENT SIGNATURE_________________________________  NURSE SIGNATURE__________________________________  ________________________________________________________________________   Adam Phenix  An incentive spirometer is a tool that can help keep your lungs clear and active. This tool measures how well you are filling your lungs with each breath. Taking long deep breaths may help reverse or decrease the chance of developing breathing (pulmonary) problems (especially infection) following:  A long period of time when you are unable to move or be active. BEFORE THE PROCEDURE   If the spirometer includes an indicator to show your best effort, your nurse or respiratory therapist will set it to a desired goal.  If possible, sit up straight or lean slightly forward. Try not to slouch.  Hold the incentive spirometer in an upright position. INSTRUCTIONS FOR USE  1. Sit on the edge of your bed if possible, or sit up as far as you can in bed or on  a chair. 2. Hold the incentive spirometer in an upright position. 3. Breathe out normally. 4. Place the mouthpiece in your mouth and seal your lips tightly around it. 5. Breathe in slowly and as deeply as possible, raising the piston or the ball toward the top of the column. 6. Hold your breath for 3-5 seconds or for as long as possible. Allow the piston or ball to fall to the bottom of the column. 7. Remove the mouthpiece from  your mouth and breathe out normally. 8. Rest for a few seconds and repeat Steps 1 through 7 at least 10 times every 1-2 hours when you are awake. Take your time and take a few normal breaths between deep breaths. 9. The spirometer may include an indicator to show your best effort. Use the indicator as a goal to work toward during each repetition. 10. After each set of 10 deep breaths, practice coughing to be sure your lungs are clear. If you have an incision (the cut made at the time of surgery), support your incision when coughing by placing a pillow or rolled up towels firmly against it. Once you are able to get out of bed, walk around indoors and cough well. You may stop using the incentive spirometer when instructed by your caregiver.  RISKS AND COMPLICATIONS  Take your time so you do not get dizzy or light-headed.  If you are in pain, you may need to take or ask for pain medication before doing incentive spirometry. It is harder to take a deep breath if you are having pain. AFTER USE  Rest and breathe slowly and easily.  It can be helpful to keep track of a log of your progress. Your caregiver can provide you with a simple table to help with this. If you are using the spirometer at home, follow these instructions: [place] IF:   You are having difficultly using the spirometer.  You have trouble using the spirometer as often as instructed.  Your pain medication is not giving enough relief while using the spirometer.  You develop fever of 100.5 F  (38.1 C) or higher. SEEK IMMEDIATE MEDICAL CARE IF:   You cough up bloody sputum that had not been present before.  You develop fever of 102 F (38.9 C) or greater.  You develop worsening pain at or near the incision site. MAKE SURE YOU:   Understand these instructions.  Will watch your condition.  Will get help right away if you are not doing well or get worse. Document Released: 12/26/2006 Document Revised: 11/07/2011 Document Reviewed: 02/26/2007 [facility] Patient Information 2014 [place], [place].   ________________________________________________________________________

## 2018-08-07 ENCOUNTER — Other Ambulatory Visit: Payer: Self-pay

## 2018-08-07 ENCOUNTER — Encounter (HOSPITAL_COMMUNITY)
Admission: RE | Admit: 2018-08-07 | Discharge: 2018-08-07 | Disposition: A | Discharge status: Home / Self Care | Payer: BC Managed Care – PPO | Source: Ambulatory Visit | Attending: Orthopedic Surgery | Admitting: Orthopedic Surgery

## 2018-08-07 ENCOUNTER — Encounter (HOSPITAL_COMMUNITY): Payer: Self-pay

## 2018-08-07 DIAGNOSIS — S83206A Unspecified tear of unspecified meniscus, current injury, right knee, initial encounter: Secondary | ICD-10-CM | POA: Insufficient documentation

## 2018-08-07 DIAGNOSIS — Z01812 Encounter for preprocedural laboratory examination: Secondary | ICD-10-CM | POA: Diagnosis not present

## 2018-08-07 LAB — CBC
HCT: 37.5 % (ref 36.0–46.0)
Hemoglobin: 12 g/dL (ref 12.0–15.0)
MCH: 28.8 pg (ref 26.0–34.0)
MCHC: 32 g/dL (ref 30.0–36.0)
MCV: 90.1 fL (ref 80.0–100.0)
Platelets: 420 10*3/uL — ABNORMAL HIGH (ref 150–400)
RBC: 4.16 MIL/uL (ref 3.87–5.11)
RDW: 13 % (ref 11.5–15.5)
WBC: 5.4 10*3/uL (ref 4.0–10.5)
nRBC: 0 % (ref 0.0–0.2)

## 2018-08-13 MED ORDER — DEXTROSE 5 % IV SOLN
3.0000 g | INTRAVENOUS | Status: AC
  Administered 2018-08-14: 3 g via INTRAVENOUS
  Filled 2018-08-13: qty 3

## 2018-08-14 ENCOUNTER — Other Ambulatory Visit: Payer: Self-pay

## 2018-08-14 ENCOUNTER — Encounter (HOSPITAL_COMMUNITY)
Admission: RE | Disposition: A | Discharge status: Home / Self Care | Payer: Self-pay | Source: Home / Self Care | Attending: Orthopedic Surgery

## 2018-08-14 ENCOUNTER — Encounter (HOSPITAL_COMMUNITY): Payer: Self-pay | Admitting: *Deleted

## 2018-08-14 ENCOUNTER — Ambulatory Visit (HOSPITAL_COMMUNITY): Payer: BC Managed Care – PPO | Admitting: Anesthesiology

## 2018-08-14 ENCOUNTER — Ambulatory Visit (HOSPITAL_COMMUNITY)
Admission: RE | Admit: 2018-08-14 | Discharge: 2018-08-14 | Disposition: A | Discharge status: Home / Self Care | Payer: BC Managed Care – PPO | Attending: Orthopedic Surgery | Admitting: Orthopedic Surgery

## 2018-08-14 DIAGNOSIS — M94261 Chondromalacia, right knee: Secondary | ICD-10-CM | POA: Insufficient documentation

## 2018-08-14 DIAGNOSIS — X58XXXA Exposure to other specified factors, initial encounter: Secondary | ICD-10-CM | POA: Insufficient documentation

## 2018-08-14 DIAGNOSIS — Z6841 Body Mass Index (BMI) 40.0 and over, adult: Secondary | ICD-10-CM | POA: Diagnosis not present

## 2018-08-14 DIAGNOSIS — S83231A Complex tear of medial meniscus, current injury, right knee, initial encounter: Secondary | ICD-10-CM | POA: Diagnosis not present

## 2018-08-14 DIAGNOSIS — S83241A Other tear of medial meniscus, current injury, right knee, initial encounter: Secondary | ICD-10-CM | POA: Diagnosis present

## 2018-08-14 DIAGNOSIS — S83206A Unspecified tear of unspecified meniscus, current injury, right knee, initial encounter: Secondary | ICD-10-CM

## 2018-08-14 HISTORY — PX: KNEE ARTHROSCOPY WITH MEDIAL MENISECTOMY: SHX5651

## 2018-08-14 SURGERY — ARTHROSCOPY, KNEE, WITH MEDIAL MENISCECTOMY
Anesthesia: General | Site: Knee | Laterality: Right

## 2018-08-14 MED ORDER — STERILE WATER FOR IRRIGATION IR SOLN
Status: DC | PRN
  Administered 2018-08-14: 500 mL

## 2018-08-14 MED ORDER — LACTATED RINGERS IV SOLN
INTRAVENOUS | Status: DC
  Administered 2018-08-14: 12:00:00 via INTRAVENOUS

## 2018-08-14 MED ORDER — ACETAMINOPHEN 500 MG PO TABS
1000.0000 mg | ORAL_TABLET | Freq: Once | ORAL | Status: AC
  Administered 2018-08-14: 1000 mg via ORAL
  Filled 2018-08-14: qty 2

## 2018-08-14 MED ORDER — DEXAMETHASONE SODIUM PHOSPHATE 10 MG/ML IJ SOLN
INTRAMUSCULAR | Status: DC | PRN
  Administered 2018-08-14: 10 mg via INTRAVENOUS

## 2018-08-14 MED ORDER — METHYLPREDNISOLONE ACETATE 80 MG/ML IJ SUSP
INTRAMUSCULAR | Status: DC | PRN
  Administered 2018-08-14: 2 mL via INTRA_ARTICULAR

## 2018-08-14 MED ORDER — LACTATED RINGERS IR SOLN
Status: DC | PRN
  Administered 2018-08-14: 6000 mL

## 2018-08-14 MED ORDER — FENTANYL CITRATE (PF) 100 MCG/2ML IJ SOLN
INTRAMUSCULAR | Status: AC
  Filled 2018-08-14: qty 2

## 2018-08-14 MED ORDER — ROCURONIUM BROMIDE 10 MG/ML (PF) SYRINGE
PREFILLED_SYRINGE | INTRAVENOUS | Status: DC | PRN
  Administered 2018-08-14: 50 mg via INTRAVENOUS
  Administered 2018-08-14: 10 mg via INTRAVENOUS

## 2018-08-14 MED ORDER — BUPIVACAINE-EPINEPHRINE (PF) 0.25% -1:200000 IJ SOLN
INTRAMUSCULAR | Status: AC
  Filled 2018-08-14: qty 30

## 2018-08-14 MED ORDER — SUGAMMADEX SODIUM 500 MG/5ML IV SOLN
INTRAVENOUS | Status: DC | PRN
  Administered 2018-08-14: 500 mg via INTRAVENOUS

## 2018-08-14 MED ORDER — KETOROLAC TROMETHAMINE 30 MG/ML IJ SOLN
INTRAMUSCULAR | Status: DC | PRN
  Administered 2018-08-14: 30 mg via INTRAVENOUS

## 2018-08-14 MED ORDER — ONDANSETRON HCL 4 MG/2ML IJ SOLN
INTRAMUSCULAR | Status: AC
  Filled 2018-08-14: qty 2

## 2018-08-14 MED ORDER — ACETAMINOPHEN 500 MG PO TABS: 1000.0000 mg | ORAL_TABLET | Freq: Three times a day (TID) | ORAL | 0 refills | Status: AC

## 2018-08-14 MED ORDER — ONDANSETRON HCL 4 MG/2ML IJ SOLN: 4.0000 mg | Freq: Four times a day (QID) | INTRAMUSCULAR | Status: DC | PRN

## 2018-08-14 MED ORDER — ASPIRIN EC 81 MG PO TBEC: 81.0000 mg | DELAYED_RELEASE_TABLET | Freq: Two times a day (BID) | ORAL | 0 refills | Status: DC

## 2018-08-14 MED ORDER — BUPIVACAINE HCL (PF) 0.25 % IJ SOLN
INTRAMUSCULAR | Status: AC
  Filled 2018-08-14: qty 30

## 2018-08-14 MED ORDER — CHLORHEXIDINE GLUCONATE 4 % EX LIQD: 60.0000 mL | Freq: Once | CUTANEOUS | Status: DC

## 2018-08-14 MED ORDER — OXYCODONE HCL 5 MG/5ML PO SOLN
5.0000 mg | Freq: Once | ORAL | Status: DC | PRN
  Filled 2018-08-14: qty 5

## 2018-08-14 MED ORDER — METHYLPREDNISOLONE ACETATE 40 MG/ML IJ SUSP
INTRAMUSCULAR | Status: AC
  Filled 2018-08-14: qty 1

## 2018-08-14 MED ORDER — MIDAZOLAM HCL 2 MG/2ML IJ SOLN
INTRAMUSCULAR | Status: AC
  Filled 2018-08-14: qty 2

## 2018-08-14 MED ORDER — ONDANSETRON HCL 4 MG/2ML IJ SOLN
INTRAMUSCULAR | Status: DC | PRN
  Administered 2018-08-14: 4 mg via INTRAVENOUS

## 2018-08-14 MED ORDER — OXYCODONE HCL 5 MG PO TABS: 5.0000 mg | ORAL_TABLET | ORAL | 0 refills | Status: AC | PRN

## 2018-08-14 MED ORDER — KETOROLAC TROMETHAMINE 30 MG/ML IJ SOLN
INTRAMUSCULAR | Status: AC
  Filled 2018-08-14: qty 1

## 2018-08-14 MED ORDER — MIDAZOLAM HCL 5 MG/5ML IJ SOLN
INTRAMUSCULAR | Status: DC | PRN
  Administered 2018-08-14: 2 mg via INTRAVENOUS

## 2018-08-14 MED ORDER — GABAPENTIN 300 MG PO CAPS
300.0000 mg | ORAL_CAPSULE | Freq: Once | ORAL | Status: AC
  Administered 2018-08-14: 300 mg via ORAL
  Filled 2018-08-14: qty 1

## 2018-08-14 MED ORDER — OXYCODONE HCL 5 MG PO TABS: 5.0000 mg | ORAL_TABLET | Freq: Once | ORAL | Status: DC | PRN

## 2018-08-14 MED ORDER — BUPIVACAINE HCL (PF) 0.5 % IJ SOLN
INTRAMUSCULAR | Status: AC
  Filled 2018-08-14: qty 30

## 2018-08-14 MED ORDER — BUPIVACAINE HCL (PF) 0.25 % IJ SOLN
INTRAMUSCULAR | Status: DC | PRN
  Administered 2018-08-14: 4 mL

## 2018-08-14 MED ORDER — FENTANYL CITRATE (PF) 100 MCG/2ML IJ SOLN
25.0000 ug | INTRAMUSCULAR | Status: DC | PRN
  Administered 2018-08-14: 50 ug via INTRAVENOUS

## 2018-08-14 MED ORDER — ONDANSETRON HCL 4 MG PO TABS: 4.0000 mg | ORAL_TABLET | Freq: Three times a day (TID) | ORAL | 0 refills | Status: DC | PRN

## 2018-08-14 MED ORDER — FENTANYL CITRATE (PF) 100 MCG/2ML IJ SOLN
INTRAMUSCULAR | Status: DC | PRN
  Administered 2018-08-14: 100 ug via INTRAVENOUS
  Administered 2018-08-14 (×2): 50 ug via INTRAVENOUS

## 2018-08-14 MED ORDER — FENTANYL CITRATE (PF) 100 MCG/2ML IJ SOLN
INTRAMUSCULAR | Status: AC
  Administered 2018-08-14: 50 ug via INTRAVENOUS
  Filled 2018-08-14: qty 2

## 2018-08-14 MED ORDER — PROPOFOL 10 MG/ML IV BOLUS
INTRAVENOUS | Status: DC | PRN
  Administered 2018-08-14: 200 mg via INTRAVENOUS

## 2018-08-14 MED ORDER — LIDOCAINE 2% (20 MG/ML) 5 ML SYRINGE
INTRAMUSCULAR | Status: DC | PRN
  Administered 2018-08-14: 60 mg via INTRAVENOUS

## 2018-08-14 MED ORDER — SUGAMMADEX SODIUM 500 MG/5ML IV SOLN
INTRAVENOUS | Status: AC
  Filled 2018-08-14: qty 5

## 2018-08-14 SURGICAL SUPPLY — 37 items
BANDAGE ACE 6X5 VEL STRL LF (GAUZE/BANDAGES/DRESSINGS) ×2 IMPLANT
BLADE CUDA GRT WHITE 3.5 (BLADE) ×2 IMPLANT
BLADE CUTTER GATOR 3.5 (BLADE) ×2 IMPLANT
CHLORAPREP W/TINT 26ML (MISCELLANEOUS) ×2 IMPLANT
COVER WAND RF STERILE (DRAPES) ×2 IMPLANT
CUFF TOURN SGL QUICK 34 (TOURNIQUET CUFF) ×2
CUFF TOURN SGL QUICK 44 (TOURNIQUET CUFF) ×2 IMPLANT
CUFF TRNQT CYL 34X4X40X1 (TOURNIQUET CUFF) ×1 IMPLANT
CUTTER MENISCUS  4.2MM (BLADE)
CUTTER MENISCUS 4.2MM (BLADE) IMPLANT
DRAPE ARTHROSCOPY W/POUCH 114 (DRAPES) ×2 IMPLANT
DRAPE IMP U-DRAPE 54X76 (DRAPES) ×2 IMPLANT
DRAPE U-SHAPE 47X51 STRL (DRAPES) ×2 IMPLANT
DRSG EMULSION OIL 3X3 NADH (GAUZE/BANDAGES/DRESSINGS) ×2 IMPLANT
DRSG KUZMA FLUFF (GAUZE/BANDAGES/DRESSINGS) ×2 IMPLANT
GAUZE SPONGE 4X4 12PLY STRL (GAUZE/BANDAGES/DRESSINGS) ×2 IMPLANT
GLOVE BIO SURGEON STRL SZ7.5 (GLOVE) ×4 IMPLANT
GLOVE BIOGEL PI IND STRL 7.5 (GLOVE) ×2 IMPLANT
GLOVE BIOGEL PI IND STRL 8 (GLOVE) ×2 IMPLANT
GLOVE BIOGEL PI INDICATOR 7.5 (GLOVE) ×2
GLOVE BIOGEL PI INDICATOR 8 (GLOVE) ×2
GOWN STRL REUS W/ TWL LRG LVL3 (GOWN DISPOSABLE) ×2 IMPLANT
GOWN STRL REUS W/ TWL XL LVL3 (GOWN DISPOSABLE) ×1 IMPLANT
GOWN STRL REUS W/TWL LRG LVL3 (GOWN DISPOSABLE) ×4
GOWN STRL REUS W/TWL XL LVL3 (GOWN DISPOSABLE) ×4 IMPLANT
KIT BASIN OR (CUSTOM PROCEDURE TRAY) ×2 IMPLANT
MANIFOLD NEPTUNE II (INSTRUMENTS) ×2 IMPLANT
PACK ARTHROSCOPY DSU (CUSTOM PROCEDURE TRAY) ×2 IMPLANT
PAD ABD 8X10 STRL (GAUZE/BANDAGES/DRESSINGS) ×2 IMPLANT
PROBE BIPOLAR ATHRO 135MM 90D (MISCELLANEOUS) IMPLANT
SUT ETHILON 3 0 PS 1 (SUTURE) ×2 IMPLANT
SYRINGE 3CC LL L/F (MISCELLANEOUS) ×2 IMPLANT
TAPE CLOTH 4X10 WHT NS (GAUZE/BANDAGES/DRESSINGS) ×2 IMPLANT
TOWEL OR 17X26 10 PK STRL BLUE (TOWEL DISPOSABLE) ×2 IMPLANT
TOWEL OR NON WOVEN STRL DISP B (DISPOSABLE) ×2 IMPLANT
TUBING ARTHRO INFLOW-ONLY STRL (TUBING) ×2 IMPLANT
WRAP KNEE MAXI GEL POST OP (GAUZE/BANDAGES/DRESSINGS) ×2 IMPLANT

## 2018-08-14 NOTE — Discharge Instructions (Signed)
Keep leg elevated with ice to reduce pain and swelling. You may increase pain medication up to 2 tablets every 4 hours for the first few days post op if needed.  Stop as needed pain medication as soon as you are able.  You may substitute or supplement pain medicine with ibuprofen if needed.  Diet: As you were doing prior to hospitalization   Dressing:  You may remove dressings and shower over incisions 3 days after surgery.  Place clean Band-Aid over incisions.  Continue to use ace wrap for compression to reduce swelling.   Activity:  Increase activity slowly as tolerated, but follow the weight bearing instructions below.  The rules on driving is that you can not be taking narcotics while you drive, and you must feel in control of the vehicle.    Weight Bearing: As tolerated   To prevent constipation: you may use a stool softener such as -  Colace (over the counter) 100 mg by mouth twice a day  Drink plenty of fluids (prune juice may be helpful) and high fiber foods Miralax (over the counter) for constipation as needed.    Itching:  If you experience itching with your medications, try taking only a single pain pill, or even half a pain pill at a time.  You can also use benadryl over the counter for itching or also to help with sleep.   Precautions:  If you experience chest pain or shortness of breath - call 911 immediately for transfer to the hospital emergency department!!  If you develop a fever greater that 101 F, purulent drainage from wound, increased redness or drainage from wound, or calf pain -- Call the office at [phone]                                                 Follow- Up Appointment:  Please call for an appointment to be seen in 2 weeks [place] - [phone]

## 2018-08-14 NOTE — Transfer of Care (Signed)
Immediate Anesthesia Transfer of Care Note  Patient: Elaijah N Hoskin  Procedure(s) Performed: RIGHT KNEE ARTHROSCOPY WITH MEDIAL MENISECTOMY AND CHONDROPLASTY (Right Knee)  Patient Location: PACU  Anesthesia Type:General  Level of Consciousness: drowsy  Airway & Oxygen Therapy: Patient Spontanous Breathing and Patient connected to face mask oxygen  Post-op Assessment: Report given to RN and Post -op Vital signs reviewed and stable  Post vital signs: Reviewed and stable  Last Vitals:  Vitals Value Taken Time  BP 142/83 08/14/2018  3:38 PM  Temp    Pulse 90 08/14/2018  3:42 PM  Resp 29 08/14/2018  3:42 PM  SpO2 100 % 08/14/2018  3:42 PM  Vitals shown include unvalidated device data.  Last Pain: There were no vitals filed for this visit.    Patients Stated Pain Goal: 3 (01/06/01 1117)  Complications: No apparent anesthesia complications

## 2018-08-14 NOTE — Anesthesia Preprocedure Evaluation (Signed)
Anesthesia Evaluation  Patient identified by MRN, date of birth, ID band Patient awake    Reviewed: Allergy & Precautions, H&P , NPO status , Patient's Chart, lab work & pertinent test results  Airway Mallampati: II   Neck ROM: full    Dental   Pulmonary neg pulmonary ROS,    breath sounds clear to auscultation       Cardiovascular negative cardio ROS   Rhythm:regular Rate:Normal     Neuro/Psych    GI/Hepatic   Endo/Other  Morbid obesity  Renal/GU      Musculoskeletal  (+) Arthritis ,   Abdominal   Peds  Hematology   Anesthesia Other Findings   Reproductive/Obstetrics                             Anesthesia Physical Anesthesia Plan  ASA: II  Anesthesia Plan: General   Post-op Pain Management:    Induction: Intravenous  PONV Risk Score and Plan: 3 and Ondansetron, Dexamethasone, Midazolam and Treatment may vary due to age or medical condition  Airway Management Planned: Oral ETT  Additional Equipment:   Intra-op Plan:   Post-operative Plan: Extubation in OR  Informed Consent: I have reviewed the patients History and Physical, chart, labs and discussed the procedure including the risks, benefits and alternatives for the proposed anesthesia with the patient or authorized representative who has indicated his/her understanding and acceptance.     Plan Discussed with: CRNA, Anesthesiologist and Surgeon  Anesthesia Plan Comments:         Anesthesia Quick Evaluation

## 2018-08-14 NOTE — Anesthesia Procedure Notes (Signed)
Procedure Name: Intubation Date/Time: 08/14/2018 2:38 PM Performed by: Sharlette Dense, CRNA Patient Re-evaluated:Patient Re-evaluated prior to induction Oxygen Delivery Method: Circle system utilized Preoxygenation: Pre-oxygenation with 100% oxygen Induction Type: IV induction Ventilation: Mask ventilation without difficulty and Oral airway inserted - appropriate to patient size Laryngoscope Size: Miller and 3 Grade View: Grade I Tube type: Oral Tube size: 7.0 mm Number of attempts: 1 Airway Equipment and Method: Stylet Placement Confirmation: ETT inserted through vocal cords under direct vision,  positive ETCO2 and breath sounds checked- equal and bilateral Secured at: 21 cm Tube secured with: Tape Dental Injury: Teeth and Oropharynx as per pre-operative assessment

## 2018-08-15 NOTE — Op Note (Signed)
08/14/2018  12:24 PM  PATIENT:  Mary Ray    PRE-OPERATIVE DIAGNOSIS:  RIGHT KNEE MEDIAL MENISCUS TEAR  POST-OPERATIVE DIAGNOSIS:  Same  PROCEDURE:  RIGHT KNEE ARTHROSCOPY WITH MEDIAL MENISECTOMY AND CHONDROPLASTY  SURGEON:  Renette Butters, MD  ASSISTANT: Roxan Hockey, PA-C, he was present and scrubbed throughout the case, critical for completion in a timely fashion, and for retraction, instrumentation, and closure.   ANESTHESIA:   General  BLOOD LOSS: min  COMPLICATIONS: None   PREOPERATIVE INDICATIONS:  Mary Ray is a  42 y.o. female with a diagnosis of [place] who failed conservative measures and elected for surgical management.    The risks benefits and alternatives were discussed with the patient preoperatively including but not limited to the risks of infection, bleeding, nerve injury, cardiopulmonary complications, the need for revision surgery, among others, and the patient was willing to proceed.  OPERATIVE IMPLANTS: none  OPERATIVE FINDINGS: Examination under anesthesia: stable Diagnostic Arthroscopy:  articular cartilage:lat tibia and PF grade 2 Medial meniscus:post tear Lateral meniscus:stable Anterior cruciate ligament/PCL: stable Loose bodies: none    OPERATIVE PROCEDURE:  Patient was identified in the preoperative holding area and site was marked by me female was transported to the operating theater and placed on the table in supine position taking care to pad all bony prominences. After a preincinduction time out anesthesia was induced.  female received ancef for preoperative antibiotics. The right extremity was prepped and draped in normal sterile fashion and a pre-incision timeout was performed.   A small stab incision was made in the anterolateral portal position. The arthroscope was introduced in the joint. A medial portal was then established under direct visualization just above the anterior horn of the medial  meniscus. Diagnostic arthroscopy was then carried out with findings as described above.  I debrided the posterior medial meniscus tear.   I performed a chondroplasty to the lateral tibia and posteriior patella with a shasver  The arthroscopic equipment was removed from the joint and the portals were closed with 3-0 nylon in an interrupted fashion. The knee was infiltrated with depomedrol.  Sterile dressings were then applied including Xeroform 4 x 4's ABDs an ACE bandage.  The patient was then allowed to awaken from general anesthesia, transferred to the stretcher and taken to the recovery room in stable condition.  POSTOPERATIVE PLAN: The patient will be discharged home today and will followup in one week for suture removal and wound check.  VTE prophylaxis: Mobilize and ASA

## 2018-08-15 NOTE — Anesthesia Postprocedure Evaluation (Signed)
Anesthesia Post Note  Patient: Mary Ray  Procedure(s) Performed: RIGHT KNEE ARTHROSCOPY WITH MEDIAL MENISECTOMY AND CHONDROPLASTY (Right Knee)     Patient location during evaluation: PACU Anesthesia Type: General Level of consciousness: awake and alert Pain management: pain level controlled Vital Signs Assessment: post-procedure vital signs reviewed and stable Respiratory status: spontaneous breathing, nonlabored ventilation, respiratory function stable and patient connected to nasal cannula oxygen Cardiovascular status: blood pressure returned to baseline and stable Postop Assessment: no apparent nausea or vomiting Anesthetic complications: no    Last Vitals:  Vitals:   08/14/18 1630 08/14/18 1715  BP: (!) 161/63 (!) 142/76  Pulse: 96 85  Resp: 18 16  Temp: 37.1 C 36.8 C  SpO2: 96% 97%    Last Pain:  Vitals:   08/14/18 1715  PainSc: [place]

## 2018-08-16 ENCOUNTER — Encounter (HOSPITAL_COMMUNITY): Payer: Self-pay | Admitting: Orthopedic Surgery

## 2018-09-11 ENCOUNTER — Other Ambulatory Visit: Payer: Self-pay | Admitting: Sports Medicine

## 2018-09-11 DIAGNOSIS — M545 Low back pain, unspecified: Secondary | ICD-10-CM

## 2018-09-15 ENCOUNTER — Ambulatory Visit
Admission: RE | Admit: 2018-09-15 | Discharge: 2018-09-15 | Disposition: A | Discharge status: Home / Self Care | Payer: BC Managed Care – PPO | Source: Ambulatory Visit | Attending: Sports Medicine | Admitting: Sports Medicine

## 2018-09-15 DIAGNOSIS — M545 Low back pain, unspecified: Secondary | ICD-10-CM

## 2018-10-16 ENCOUNTER — Ambulatory Visit: Payer: BC Managed Care – PPO | Attending: Orthopedic Surgery | Admitting: Physical Therapy

## 2018-10-16 ENCOUNTER — Encounter: Payer: Self-pay | Admitting: Physical Therapy

## 2018-10-16 ENCOUNTER — Other Ambulatory Visit: Payer: Self-pay

## 2018-10-16 DIAGNOSIS — M545 Low back pain, unspecified: Secondary | ICD-10-CM

## 2018-10-16 DIAGNOSIS — M6281 Muscle weakness (generalized): Secondary | ICD-10-CM | POA: Diagnosis present

## 2018-10-16 DIAGNOSIS — G8929 Other chronic pain: Secondary | ICD-10-CM | POA: Diagnosis present

## 2018-10-16 NOTE — Therapy (Signed)
[place], [place], [address] Phone: [phone]   Fax:  [phone]  Physical Therapy Evaluation  Patient Details  Name: Mary Ray MRN: [id number] Date of Birth: November 07, 1975 Referring Provider (PT): Edmonia Lynch, MD   Encounter Date: 10/16/2018  PT End of Session - 10/16/18 1514    Visit Number  1    Number of Visits  13    Date for PT Re-Evaluation  12/04/18    Authorization Type  BCBS primary, Medicaid secondary    PT Start Time  1412    PT Stop Time  1503    PT Time Calculation (min)  51 min    Activity Tolerance  Patient limited by pain    Behavior During Therapy  [facility] for tasks assessed/performed       Past Medical History:  Diagnosis Date  . Arthritis    Back    Past Surgical History:  Procedure Laterality Date  . ABDOMINAL HYSTERECTOMY [name] 10/19/2015   Procedure:  HYSTERECTOMY ABDOMINAL WITH RIGHT SALPINGECTOMYand left oophorectomy;  Surgeon: Molli Posey, MD;  Location: [facility];  Service: Gynecology;  Laterality: [name];  . BREAST REDUCTION SURGERY    . CESAREAN SECTION  08/16/2011   Procedure: CESAREAN SECTION;  Surgeon: Darlyn Chamber;  Location: [facility];  Service: Gynecology;  Laterality: [name];  . CHOLECYSTECTOMY [name] 09/29/2017   Procedure: LAPAROSCOPIC CHOLECYSTECTOMY WITH INTRAOPERATIVE CHOLANGIOGRAM;  Surgeon: Armandina Gemma, MD;  Location: [facility];  Service: General;  Laterality: [name];  . DIAGNOSTIC LAPAROSCOPY    . KNEE ARTHROSCOPY WITH MEDIAL MENISECTOMY Right 08/14/2018   Procedure: RIGHT KNEE ARTHROSCOPY WITH MEDIAL MENISECTOMY AND CHONDROPLASTY;  Surgeon: Renette Butters, MD;  Location: [facility];  Service: Orthopedics;  Laterality: Right;  . MOUTH SURGERY    . SALPINGECTOMY  2004   Scar tissue???  . WISDOM TOOTH EXTRACTION      There were no vitals filed for this visit.   Subjective Assessment - 10/16/18 1417    Subjective  Pt. reports approximately 10 year history of  insidious onset low back pain symptoms wihch have been worse since around June 2019. Of note pt. is also s./p right knee arthroscopic surgery 08/14/18. She notes some posterior thigh and adductor region discomfort both before and after knee surgery otherwise on radiating/radicular symptoms noted. Pt. had MRI 09/15/18 wihch showed mild disc bulges on left but was otherwise (-) for neurological compression.    Pertinent History  chronic LBP history, right knee scope 08/14/18, obesity    Limitations  Standing;Walking;Lifting;House hold activities    How long can you sit comfortably?  varies, standing and activity are worse than sitting    How long can you stand comfortably?  unable comfortably    How long can you walk comfortably?  unable comfortably    Diagnostic tests  MRI, X-rays    Patient Stated Goals  relieve pain    Currently in Pain?  Yes    Pain Score  7     Pain Location  Back    Pain Orientation  Lower    Pain Descriptors / Indicators  Discomfort    Pain Type  Chronic pain    Pain Onset  More than a month ago    Pain Frequency  Intermittent    Aggravating Factors   activity, posture for bending forward at sink, work duties    Pain Relieving Factors  no eases noted    Effect of Pain on Daily Activities  limits tolerance work duties         [employer] PT Assessment - 10/16/18 0001      Assessment   Medical Diagnosis  Low back pain    Referring Provider (PT)  Edmonia Lynch, MD    Onset Date/Surgical Date  01/27/18   estimated time of exacerbation of chronic LBP   Prior Therapy  --   pt. reports 3 PT visits last year-thinks was for knee     Precautions   Precautions  None      Restrictions   Weight Bearing Restrictions  No      Balance Screen   Has the patient fallen in the past 6 months  No      [place] residence    Type of Home  Apartment    Home Access  --   12 steps to enter     Prior Function   Level of Independence   Independent with basic ADLs      Cognition   Overall Cognitive Status  Within Functional Limits for tasks assessed      Observation/Other Assessments   Focus on Therapeutic Outcomes (FOTO)   50% limited      Sensation   Light Touch  --   hypersensitivity on right at L4 region otherwise intact     ROM / Strength   AROM / PROM / Strength  AROM;PROM;Strength      AROM   AROM Assessment Site  Lumbar    Lumbar Flexion  70    Lumbar Extension  20    Lumbar - Right Side Bend  35    Lumbar - Left Side Bend  30    Lumbar - Right Rotation  WFL    Lumbar - Left Rotation  WFL      PROM   Overall PROM Comments  Hip PROM limited to 90 deg bilat. with soft tissue restriction, difficulty tolerating assessment of hamstring flexbility on right with right leg pain at 60 deg SLR      Strength   Strength Assessment Site  Hip;Knee;Ankle    Right/Left Hip  Right;Left    Right Hip Flexion  4/5    Right Hip External Rotation   4/5    Right Hip Internal Rotation  4/5    Left Hip Flexion  5/5    Left Hip External Rotation  5/5    Left Hip Internal Rotation  5/5    Right/Left Knee  Right;Left    Right Knee Flexion  4/5    Right Knee Extension  4/5    Left Knee Flexion  5/5    Left Knee Extension  5/5    Right/Left Ankle  Right;Left    Right Ankle Dorsiflexion  4/5    Right Ankle Inversion  4/5    Right Ankle Eversion  4/5    Left Ankle Dorsiflexion  5/5    Left Ankle Inversion  5/5    Left Ankle Eversion  5/5      Flexibility   Soft Tissue Assessment /Muscle Length  --   see PROM     Palpation   Palpation comment  General tenderness to palpation bilat. lower lumbar paraspinals and lumbosacral region      Special Tests   Other special tests  pain with right SLR at 50-60 deg PROM-see plan/assessment                Objective measurements completed on examination: See  above findings.      [facility] PT Treatment/Exercise - 10/16/18 0001      Exercises   Exercises  Lumbar       Lumbar Exercises: Stretches   Lower Trunk Rotation  --   HEP instruction with brief practice   Pelvic Tilt  5 reps    Other Lumbar Stretch Exercise  attempted dural nerve floss but held due to pain      Lumbar Exercises: Supine   Other Supine Lumbar Exercises  HEP instruction with brief practice hip add. isometric      Modalities   Modalities  Moist Heat      Moist Heat Therapy   Number Minutes Moist Heat  10 Minutes    Moist Heat Location  Lumbar Spine   supine with legs on bolster            PT Education - 10/16/18 1513    Education Details  HEP, POC, potential symptom etiology, body mechanics for standing at sink and for lifting activities    Person(s) Educated  Patient    Methods  Explanation;Demonstration;Tactile cues;Verbal cues;Handout    Comprehension  Verbal cues required;Returned demonstration;Verbalized understanding;Tactile cues required       PT Short Term Goals - 10/16/18 1527      PT SHORT TERM GOAL #1   Title  Independent with HEP    Baseline  no HEP    Time  3    Period  Weeks    Status  New    Target Date  11/06/18      PT SHORT TERM GOAL #2   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, dishes, lifting activities    Baseline  needs cues    Time  3    Period  Weeks    Status  New    Target Date  11/06/18      PT SHORT TERM GOAL #3   Title  Tolerate standing 10-15 minutes at work with LBP 5/10 or less    Baseline  7/10    Time  3    Period  Weeks    Status  New    Target Date  11/06/18        PT Long Term Goals - 10/16/18 1529      PT LONG TERM GOAL #1   Title  Improve FOTO outcome measure score to 35% or less impairment    Time  6    Period  Weeks    Status  New    Target Date  11/27/18      PT LONG TERM GOAL #2   Title  Increase right LE strength grossly 1/2 MMT grade or greater to improve ability to navigate steps to apartment and for lifting for chores    Baseline  see flowsheet-grossly 4/5    Time  6     Period  Weeks    Status  New    Target Date  11/27/18      PT LONG TERM GOAL #3   Title  Increase trunk flexion AROM 10 deg or greater to improve ability to tie shoes, perform bending for chores    Baseline  70 deg    Time  6    Period  Weeks    Status  New    Target Date  11/27/18      PT LONG TERM GOAL #4   Title  Tolerate standing, ambulation for work duties and activities such  as grocery shopping periods at least 30-40 minutes with pain 5/10 or less    Baseline  7/10, diffficulty tolerating due to pain    Time  6    Period  Weeks    Status  New    Target Date  11/27/18             Plan - 10/16/18 1515    Clinical Impression Statement  Pt. presents with chronic LBP exacerbated beginning last Summer. Primary symptoms appear axial in nature. Pt. does present with right LE weakness but suspect this could be associated with recent s/p right knee surgery. She does also present with RLE pain with differential diagnosis including muscular vs,. versus nerve tension vs. residual soreness s/p knee surgery-MRI per report showed mild disc bulges but was (-) for neurologic compression so would not seem to be radicular. Plan trial PT to address current pain and associated functional limitations for activity tolerance.,    History and Personal Factors relevant to plan of care:  knee surgery 08/14/18, obesity with BMI 49.5, chronic pain history    Clinical Presentation  Evolving    Clinical Presentation due to:  unclear etiology right leg symptoms    Clinical Decision Making  Moderate    Rehab Potential  Fair    Clinical Impairments Affecting Rehab Potential  chronic pain history, obesity, unclear etiology right leg symptoms    PT Frequency  2x / week    PT Duration  6 weeks    PT Treatment/Interventions  ADLs/Self Care Home Management;Cryotherapy;Ultrasound;Moist Heat;Taping;Dry needling;Manual techniques;Patient/family education;Therapeutic activities;Therapeutic exercise;Electrical  Stimulation;Functional mobility training;Neuromuscular re-education    PT Next Visit Plan  Try NUSTEP warm up, review HEP as needed pelvic tilts, LTR, hip add. isometric, pending tolerance try SKTC, bent knee raise, piriformis stretch ea. (unable to tolerate hamstring stretch or supine nerve floss at eval-try seated nerve floss next time), Lumbar STM vs. potential trial dry needling lumbar paraspinals, review body mechanics, modalities prn    PT Home Exercise Plan  pelvic tilts, LTR, hip add. isometrics, had tried supine dural nerve floss but held due to pain    Consulted and Agree with Plan of Care  Patient       Patient will benefit from skilled therapeutic intervention in order to improve the following deficits and impairments:  Obesity, Impaired flexibility, Difficulty walking, Decreased activity tolerance, Decreased range of motion, Decreased strength, Pain, Improper body mechanics, Decreased mobility  Visit Diagnosis: Chronic midline low back pain without sciatica  Muscle weakness (generalized)     Problem List Patient Active Problem List   Diagnosis Date Noted  . Cholelithiasis with chronic cholecystitis 09/26/2017  . Menorrhagia 10/19/2015  . Threatened preterm labor, antepartum 06/18/2011    Beaulah Dinning, PT, DPT 10/16/18 3:34 PM  [facility] [address] [place], [place], [address] Phone: [phone]   Fax:  [phone]  Name: Mary Ray MRN: [id number] Date of Birth: 08/03/1976

## 2018-10-24 ENCOUNTER — Ambulatory Visit: Payer: BC Managed Care – PPO | Admitting: Physical Therapy

## 2018-10-24 DIAGNOSIS — M545 Low back pain: Principal | ICD-10-CM

## 2018-10-24 DIAGNOSIS — G8929 Other chronic pain: Secondary | ICD-10-CM

## 2018-10-24 DIAGNOSIS — M6281 Muscle weakness (generalized): Secondary | ICD-10-CM

## 2018-10-24 NOTE — Therapy (Signed)
[place], [place], [address] Phone: [phone]   Fax:  [phone]  Physical Therapy Treatment  Patient Details  Name: Mary Ray MRN: [id number] Date of Birth: 06/25/1976 Referring Provider (PT): Edmonia Lynch, MD   Encounter Date: 10/24/2018  PT End of Session - 10/24/18 1415    Visit Number  2    Number of Visits  13    Date for PT Re-Evaluation  12/04/18    Authorization Type  BCBS primary, Medicaid secondary    Authorization Time Period  MCD approved 3 visits through 11/11/2018    Authorization - Visit Number  1    Authorization - Number of Visits  3    PT Start Time  1416    PT Stop Time  1507   on heat at end of session   PT Time Calculation (min)  51 min    Activity Tolerance  Patient tolerated treatment well       Past Medical History:  Diagnosis Date  . Arthritis    Back    Past Surgical History:  Procedure Laterality Date  . ABDOMINAL HYSTERECTOMY [name] 10/19/2015   Procedure:  HYSTERECTOMY ABDOMINAL WITH RIGHT SALPINGECTOMYand left oophorectomy;  Surgeon: Molli Posey, MD;  Location: [facility];  Service: Gynecology;  Laterality: [name];  . BREAST REDUCTION SURGERY    . CESAREAN SECTION  08/16/2011   Procedure: CESAREAN SECTION;  Surgeon: Darlyn Chamber;  Location: [facility];  Service: Gynecology;  Laterality: [name];  . CHOLECYSTECTOMY [name] 09/29/2017   Procedure: LAPAROSCOPIC CHOLECYSTECTOMY WITH INTRAOPERATIVE CHOLANGIOGRAM;  Surgeon: Armandina Gemma, MD;  Location: [facility];  Service: General;  Laterality: [name];  . DIAGNOSTIC LAPAROSCOPY    . KNEE ARTHROSCOPY WITH MEDIAL MENISECTOMY Right 08/14/2018   Procedure: RIGHT KNEE ARTHROSCOPY WITH MEDIAL MENISECTOMY AND CHONDROPLASTY;  Surgeon: Renette Butters, MD;  Location: [facility];  Service: Orthopedics;  Laterality: Right;  . MOUTH SURGERY    . SALPINGECTOMY  2004   Scar tissue???  . WISDOM TOOTH EXTRACTION      There were no vitals filed for  this visit.  Subjective Assessment - 10/24/18 1416    Subjective  Pt reports she is doing exercise as able at home,     Currently in Pain?  Yes    Pain Score  7     Pain Location  Back    Pain Orientation  Lower    Pain Descriptors / Indicators  Discomfort    Pain Type  Chronic pain    Pain Onset  More than a month ago    Pain Frequency  Intermittent    Multiple Pain Sites  Yes    Pain Score  10    Pain Location  Leg    Pain Orientation  Right    Pain Descriptors / Indicators  Shooting;Sharp;Tightness    Pain Type  Chronic pain;Surgical pain    Pain Radiating Towards  from knee surgery 08/14/18    Pain Onset  More than a month ago    Pain Frequency  Constant                       [facility] Adult PT Treatment/Exercise - 10/24/18 0001      Lumbar Exercises: Stretches   Quad Stretch  Right;3 reps;20 seconds;Left      Lumbar Exercises: Aerobic   Nustep  L4 x3' U'LE stopped d/t Rt thigh pain      Lumbar Exercises: Standing  Other Standing Lumbar Exercises  10 reps HS curls, then leg swings FWD/BWD       Lumbar Exercises: Seated   Long Arc Quad on Chair  Strengthening;Right;10 reps    Other Seated Lumbar Exercises  10 reps marching, then 10 marching with arms overhead      Lumbar Exercises: Supine   Bridge  10 reps   VC for form   Isometric Hip Flexion  10 reps;3 seconds      Lumbar Exercises: Sidelying   Clam  Both;10 reps   regular and reverse   Other Sidelying Lumbar Exercises  10 reps CW/CCW circles - very painful on Rt LE      Lumbar Exercises: Prone   Straight Leg Raise  10 reps   difficult on Rt side   Other Prone Lumbar Exercises  10 reps press ups, 10 x5sec prone quad sets      Modalities   Modalities  Moist Heat      Moist Heat Therapy   Number Minutes Moist Heat  15 Minutes    Moist Heat Location  Lumbar Spine   and Rt quad              PT Short Term Goals - 10/16/18 1527      PT SHORT TERM GOAL #1   Title  Independent with  HEP    Baseline  no HEP    Time  3    Period  Weeks    Status  New    Target Date  11/06/18      PT SHORT TERM GOAL #2   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, dishes, lifting activities    Baseline  needs cues    Time  3    Period  Weeks    Status  New    Target Date  11/06/18      PT SHORT TERM GOAL #3   Title  Tolerate standing 10-15 minutes at work with LBP 5/10 or less    Baseline  7/10    Time  3    Period  Weeks    Status  New    Target Date  11/06/18        PT Long Term Goals - 10/16/18 1529      PT LONG TERM GOAL #1   Title  Improve FOTO outcome measure score to 35% or less impairment    Time  6    Period  Weeks    Status  New    Target Date  11/27/18      PT LONG TERM GOAL #2   Title  Increase right LE strength grossly 1/2 MMT grade or greater to improve ability to navigate steps to apartment and for lifting for chores    Baseline  see flowsheet-grossly 4/5    Time  6    Period  Weeks    Status  New    Target Date  11/27/18      PT LONG TERM GOAL #3   Title  Increase trunk flexion AROM 10 deg or greater to improve ability to tie shoes, perform bending for chores    Baseline  70 deg    Time  6    Period  Weeks    Status  New    Target Date  11/27/18      PT LONG TERM GOAL #4   Title  Tolerate standing, ambulation for work duties and activities such as grocery shopping  periods at least 30-40 minutes with pain 5/10 or less    Baseline  7/10, diffficulty tolerating due to pain    Time  6    Period  Weeks    Status  New    Target Date  11/27/18            Plan - 10/24/18 1454    Clinical Impression Statement  Mary Ray has a lot of pain in the Rt quad/knee from her surgery there end of last year.  This is currently the biggest limiting factor to her improving her function.  She was able to perform ex, pushed through pain and did have some loosening up of the quad.  Recommend she use heat on her leg to see if that will help her  more.      Rehab Potential  Fair    Clinical Impairments Affecting Rehab Potential  chronic pain history, obesity, unclear etiology right leg symptoms    PT Frequency  2x / week    PT Duration  6 weeks    PT Treatment/Interventions  ADLs/Self Care Home Management;Cryotherapy;Ultrasound;Moist Heat;Taping;Dry needling;Manual techniques;Patient/family education;Therapeutic activities;Therapeutic exercise;Electrical Stimulation;Functional mobility training;Neuromuscular re-education    PT Next Visit Plan  attempt nustep again, gentle motion/sterngthening as able.     Consulted and Agree with Plan of Care  Patient       Patient will benefit from skilled therapeutic intervention in order to improve the following deficits and impairments:  Obesity, Impaired flexibility, Difficulty walking, Decreased activity tolerance, Decreased range of motion, Decreased strength, Pain, Improper body mechanics, Decreased mobility  Visit Diagnosis: Chronic midline low back pain without sciatica  Muscle weakness (generalized)     Problem List Patient Active Problem List   Diagnosis Date Noted  . Cholelithiasis with chronic cholecystitis 09/26/2017  . Menorrhagia 10/19/2015  . Threatened preterm labor, antepartum 06/18/2011    Jeral Pinch PT  10/24/2018, 2:56 PM  [facility] [address] [place], [place], [address] Phone: [phone]   Fax:  [phone]  Name: Mary Ray MRN: [id number] Date of Birth: 1975/12/05

## 2018-10-30 ENCOUNTER — Encounter: Payer: Self-pay | Admitting: Physical Therapy

## 2018-10-30 ENCOUNTER — Ambulatory Visit: Payer: BC Managed Care – PPO | Attending: Orthopedic Surgery | Admitting: Physical Therapy

## 2018-10-30 DIAGNOSIS — M6281 Muscle weakness (generalized): Secondary | ICD-10-CM | POA: Diagnosis present

## 2018-10-30 DIAGNOSIS — M545 Low back pain: Secondary | ICD-10-CM | POA: Insufficient documentation

## 2018-10-30 DIAGNOSIS — G8929 Other chronic pain: Secondary | ICD-10-CM | POA: Diagnosis present

## 2018-10-30 NOTE — Therapy (Signed)
[place], [place], [address] Phone: [phone]   Fax:  [phone]  Physical Therapy Treatment  Patient Details  Name: Mary Ray MRN: [id number] Date of Birth: Aug 11, 1976 Referring Provider (PT): Edmonia Lynch, MD   Encounter Date: 10/30/2018  PT End of Session - 10/30/18 1443    Visit Number  3    Number of Visits  13    Date for PT Re-Evaluation  12/04/18    Authorization Type  BCBS primary, Medicaid secondary    Authorization Time Period  MCD approved 3 visits through 11/11/2018    Authorization - Visit Number  2    Authorization - Number of Visits  3    PT Start Time  9892    PT Stop Time  1452    PT Time Calculation (min)  40 min    Activity Tolerance  Patient tolerated treatment well    Behavior During Therapy  [facility] for tasks assessed/performed       Past Medical History:  Diagnosis Date  . Arthritis    Back    Past Surgical History:  Procedure Laterality Date  . ABDOMINAL HYSTERECTOMY [name] 10/19/2015   Procedure:  HYSTERECTOMY ABDOMINAL WITH RIGHT SALPINGECTOMYand left oophorectomy;  Surgeon: Molli Posey, MD;  Location: [place] [facility];  Service: Gynecology;  Laterality: [name];  . BREAST REDUCTION SURGERY    . CESAREAN SECTION  08/16/2011   Procedure: CESAREAN SECTION;  Surgeon: Darlyn Chamber;  Location: [place] [facility];  Service: Gynecology;  Laterality: [name];  . CHOLECYSTECTOMY [name] 09/29/2017   Procedure: LAPAROSCOPIC CHOLECYSTECTOMY WITH INTRAOPERATIVE CHOLANGIOGRAM;  Surgeon: Armandina Gemma, MD;  Location: [facility];  Service: General;  Laterality: [name];  . DIAGNOSTIC LAPAROSCOPY    . KNEE ARTHROSCOPY WITH MEDIAL MENISECTOMY Right 08/14/2018   Procedure: RIGHT KNEE ARTHROSCOPY WITH MEDIAL MENISECTOMY AND CHONDROPLASTY;  Surgeon: Renette Butters, MD;  Location: [facility];  Service: Orthopedics;  Laterality: Right;  . MOUTH SURGERY    . SALPINGECTOMY  2004   Scar tissue???  . WISDOM TOOTH EXTRACTION       There were no vitals filed for this visit.  Subjective Assessment - 10/30/18 1414    Subjective  "Good day" today without pain but has not been at work. She continues with issues with pain from standing at work in right leg.    Currently in Pain?  No/denies    Pain Score  0-No pain                       [facility] Adult PT Treatment/Exercise - 10/30/18 0001      Lumbar Exercises: Stretches   Passive Hamstring Stretch  Right;Left;3 reps;20 seconds    Prone on Elbows Stretch Limitations  POE x 2 min    Press Ups  10 reps    Press Ups Limitations  elbows extended    Quad Stretch  Right;3 reps;20 seconds      Lumbar Exercises: Supine   Pelvic Tilt  15 reps    Clam  15 reps    Clam Limitations  Green band    Bent Knee Raise  20 reps    Bridge  20 reps    Bridge Limitations  legs on roll/bolster    Straight Leg Raise  20 reps    Straight Leg Raises Limitations  2x10    Other Supine Lumbar Exercises  hooklying hip add. isometric x 15 reps      Manual Therapy  Manual Therapy  Joint mobilization;Soft tissue mobilization    Joint Mobilization  Right hip long axis ditsraction grade I-III    Soft tissue mobilization  IASTM right quad and lateral hip, IT band             PT Education - 10/30/18 1443    Education Details  POC, potential symptom etiology exercises    Person(s) Educated  Patient    Methods  Explanation    Comprehension  Verbalized understanding;Returned demonstration       PT Short Term Goals - 10/16/18 1527      PT SHORT TERM GOAL #1   Title  Independent with HEP    Baseline  no HEP    Time  3    Period  Weeks    Status  New    Target Date  11/06/18      PT SHORT TERM GOAL #2   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, dishes, lifting activities    Baseline  needs cues    Time  3    Period  Weeks    Status  New    Target Date  11/06/18      PT SHORT TERM GOAL #3   Title  Tolerate standing 10-15 minutes at work  with LBP 5/10 or less    Baseline  7/10    Time  3    Period  Weeks    Status  New    Target Date  11/06/18        PT Long Term Goals - 10/16/18 1529      PT LONG TERM GOAL #1   Title  Improve FOTO outcome measure score to 35% or less impairment    Time  6    Period  Weeks    Status  New    Target Date  11/27/18      PT LONG TERM GOAL #2   Title  Increase right LE strength grossly 1/2 MMT grade or greater to improve ability to navigate steps to apartment and for lifting for chores    Baseline  see flowsheet-grossly 4/5    Time  6    Period  Weeks    Status  New    Target Date  11/27/18      PT LONG TERM GOAL #3   Title  Increase trunk flexion AROM 10 deg or greater to improve ability to tie shoes, perform bending for chores    Baseline  70 deg    Time  6    Period  Weeks    Status  New    Target Date  11/27/18      PT LONG TERM GOAL #4   Title  Tolerate standing, ambulation for work duties and activities such as grocery shopping periods at least 30-40 minutes with pain 5/10 or less    Baseline  7/10, diffficulty tolerating due to pain    Time  6    Period  Weeks    Status  New    Target Date  11/27/18            Plan - 10/30/18 1444    Clinical Impression Statement  Fair status with progress from baseline with continue right LE pain-still unclear etiology if associated with lumbar region/radicular given MRI (-) nerve compression vs. if associated with right knee s/p recent knee scope.    Stability/Clinical Decision Making  Evolving/Moderate complexity    Clinical Decision Making  Moderate    Rehab Potential  Fair    Clinical Impairments Affecting Rehab Potential  chronic pain history, obesity, unclear etiology right leg symptoms    PT Frequency  2x / week    PT Duration  6 weeks    PT Treatment/Interventions  ADLs/Self Care Home Management;Cryotherapy;Ultrasound;Moist Heat;Taping;Dry needling;Manual techniques;Patient/family education;Therapeutic  activities;Therapeutic exercise;Electrical Stimulation;Functional mobility training;Neuromuscular re-education    PT Next Visit Plan  attempt nustep again, gentle motion/strengthening as able.     PT Home Exercise Plan  pelvic tilts, LTR, hip add. isometrics, had tried supine dural nerve floss but held due to pain    Consulted and Agree with Plan of Care  Patient       Patient will benefit from skilled therapeutic intervention in order to improve the following deficits and impairments:  Obesity, Impaired flexibility, Difficulty walking, Decreased activity tolerance, Decreased range of motion, Decreased strength, Pain, Improper body mechanics, Decreased mobility  Visit Diagnosis: Chronic midline low back pain without sciatica  Muscle weakness (generalized)     Problem List Patient Active Problem List   Diagnosis Date Noted  . Cholelithiasis with chronic cholecystitis 09/26/2017  . Menorrhagia 10/19/2015  . Threatened preterm labor, antepartum 06/18/2011   Beaulah Dinning, PT, DPT 10/30/18 2:52 PM  [facility] [address] [place], [place], [address] Phone: [phone]   Fax:  [phone]  Name: Mary Ray MRN: [id number] Date of Birth: [birth year]

## 2018-10-31 ENCOUNTER — Ambulatory Visit: Payer: BC Managed Care – PPO | Admitting: Physical Therapy

## 2018-10-31 ENCOUNTER — Encounter: Payer: Self-pay | Admitting: Physical Therapy

## 2018-10-31 DIAGNOSIS — M545 Low back pain, unspecified: Secondary | ICD-10-CM

## 2018-10-31 DIAGNOSIS — M6281 Muscle weakness (generalized): Secondary | ICD-10-CM

## 2018-10-31 DIAGNOSIS — G8929 Other chronic pain: Secondary | ICD-10-CM

## 2018-10-31 NOTE — Therapy (Signed)
[place], [place], [address] Phone: [phone]   Fax:  [phone]  Physical Therapy Treatment  Patient Details  Name: Mary Ray MRN: [id number] Date of Birth: [birth year] Referring Provider (PT): Edmonia Lynch, MD   Encounter Date: 10/31/2018  PT End of Session - 10/31/18 1502    Visit Number  4    Number of Visits  13    Date for PT Re-Evaluation  12/04/18    Authorization Type  BCBS primary, Medicaid secondary    Authorization Time Period  MCD approved 3 visits through 11/11/2018    Authorization - Visit Number  3    Authorization - Number of Visits  3    PT Start Time  1414    PT Stop Time  1457    PT Time Calculation (min)  43 min    Activity Tolerance  Patient tolerated treatment well    Behavior During Therapy  [facility] for tasks assessed/performed       Past Medical History:  Diagnosis Date  . Arthritis    Back    Past Surgical History:  Procedure Laterality Date  . ABDOMINAL HYSTERECTOMY [name] 10/19/2015   Procedure:  HYSTERECTOMY ABDOMINAL WITH RIGHT SALPINGECTOMYand left oophorectomy;  Surgeon: Molli Posey, MD;  Location: [place] [facility];  Service: Gynecology;  Laterality: [name];  . BREAST REDUCTION SURGERY    . CESAREAN SECTION  08/16/2011   Procedure: CESAREAN SECTION;  Surgeon: Darlyn Chamber;  Location: [place] [facility];  Service: Gynecology;  Laterality: [name];  . CHOLECYSTECTOMY [name] 09/29/2017   Procedure: LAPAROSCOPIC CHOLECYSTECTOMY WITH INTRAOPERATIVE CHOLANGIOGRAM;  Surgeon: Armandina Gemma, MD;  Location: [facility];  Service: General;  Laterality: [name];  . DIAGNOSTIC LAPAROSCOPY    . KNEE ARTHROSCOPY WITH MEDIAL MENISECTOMY Right 08/14/2018   Procedure: RIGHT KNEE ARTHROSCOPY WITH MEDIAL MENISECTOMY AND CHONDROPLASTY;  Surgeon: Renette Butters, MD;  Location: [facility];  Service: Orthopedics;  Laterality: Right;  . MOUTH SURGERY    . SALPINGECTOMY  2004   Scar tissue???  . WISDOM TOOTH EXTRACTION       There were no vitals filed for this visit.  Subjective Assessment - 10/31/18 1417    Subjective  Pt. reports that her back "felt a whole lot better" after last session but continues with right knee limitations-she reports that Dr. Percell Miller wrote a PT prescription for her knee but does not have currently at session. Plan is to bring by clinic when able and will re-eval for new plan of care to include knee next session.                       [facility] Adult PT Treatment/Exercise - 10/31/18 0001      Lumbar Exercises: Stretches   Passive Hamstring Stretch  Right;Left;3 reps;30 seconds    Prone on Elbows Stretch Limitations  POE x 2 min    Quad Stretch  Right;3 reps;20 seconds    Other Lumbar Stretch Exercise  supine manual right  adductor stretch 3x30 sec      Lumbar Exercises: Supine   Pelvic Tilt  15 reps    Clam  15 reps    Clam Limitations  Green band    Bent Knee Raise  20 reps    Bridge  20 reps    Bridge Limitations  legs on bolster    Straight Leg Raise  20 reps    Straight Leg Raises Limitations  2x10    Other Supine Lumbar  Exercises  hooklying hip add. isometric with ball 2x10      Manual Therapy   Joint Mobilization  Right hip long axis distraction grade I-III    Soft tissue mobilization  IASTM/foam roll use right quad, adductors, and IT band             PT Education - 10/31/18 1502    Education Details  POC, differential diagnosis right leg pain    Person(s) Educated  Patient    Methods  Explanation    Comprehension  Verbalized understanding       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, dishes, lifting activities    Baseline  reviewed today, met    Time  3     Period  Weeks    Status  Achieved    Target Date  11/06/18      PT SHORT TERM GOAL #3   Title  Tolerate standing 10-15 minutes at work with LBP 5/10 or less    Baseline  met for back (still limited by knee pain)    Time  3    Period  Weeks    Status  Achieved    Target Date  11/06/18        PT Long Term Goals - 10/31/18 1507      PT LONG TERM GOAL #1   Title  Improve FOTO outcome measure score to 35% or less impairment    Baseline  50% limited at eval, not retested today    Time  6    Period  Weeks    Status  On-going      PT LONG TERM GOAL #2   Title  Increase right LE strength grossly 1/2 MMT grade or greater to improve ability to navigate steps to apartment and for lifting for chores    Baseline  grossly 4/5    Time  6    Period  Weeks    Status  Not Met      PT LONG TERM GOAL #3   Title  Increase trunk flexion AROM 10 deg or greater to improve ability to tie shoes, perform bending for chores    Baseline  70 deg at eval, not retested today    Time  6    Period  Weeks    Status  On-going      PT LONG TERM GOAL #4   Title  Tolerate standing, ambulation for work duties and activities such as grocery shopping periods at least 30-40 minutes with pain 5/10 or less    Baseline  diffficulty tolerating due to pain    Time  6    Period  Weeks    Status  On-going            Plan - 10/31/18 1503    Clinical Impression Statement  Mild improvement from baseline with LBP but continues with right knee associated limitations-per pt. report of receiving new prescription for knee (not present at session today) will plan schedule re-eval pending receipt of prescription to include knee  in plan of care. Will plan to request further therapy visits at re-evaluation visit as needed.    Stability/Clinical Decision Making  Evolving/Moderate complexity    Clinical Decision Making  Moderate    Rehab Potential  Fair    Clinical Impairments Affecting Rehab Potential  chronic pain  history, obesity, unclear etiology right leg symptoms    PT Frequency  2x / week    PT Duration  6 weeks    PT Treatment/Interventions  ADLs/Self Care Home Management;Cryotherapy;Ultrasound;Moist Heat;Taping;Dry needling;Manual techniques;Patient/family education;Therapeutic activities;Therapeutic exercise;Electrical Stimulation;Functional mobility training;Neuromuscular re-education    PT Next Visit Plan  re-eval to include right knee with LBP if therapy prescription received    PT Home Exercise Plan  pelvic tilts, LTR, hip add. isometrics, had tried supine dural nerve floss but held due to pain    Consulted and Agree with Plan of Care  Patient       Patient will benefit from skilled therapeutic intervention in order to improve the following deficits and impairments:  Obesity, Impaired flexibility, Difficulty walking, Decreased activity tolerance, Decreased range of motion, Decreased strength, Pain, Improper body mechanics, Decreased mobility  Visit Diagnosis: Chronic midline low back pain without sciatica  Muscle weakness (generalized)     Problem List Patient Active Problem List   Diagnosis Date Noted  . Cholelithiasis with chronic cholecystitis 09/26/2017  . Menorrhagia 10/19/2015  . Threatened preterm labor, antepartum 06/18/2011    Beaulah Dinning, PT, DPT 10/31/18 3:13 PM  [place], [place], [address] Phone: [phone]   Fax:  [phone]  Name: Mary Ray MRN: [id number] Date of Birth: [birth year]

## 2018-11-19 ENCOUNTER — Ambulatory Visit: Payer: BC Managed Care – PPO | Admitting: Physical Therapy

## 2018-12-05 ENCOUNTER — Telehealth (HOSPITAL_COMMUNITY): Payer: Self-pay | Admitting: Physical Therapy

## 2018-12-05 NOTE — Telephone Encounter (Signed)
Spoke to Mary Ray and she stated she was not sure about the telehealth but was still wanting to return to formal PT when able.  She was asked to call if telehealth visit was an option for her . I asked her to call if she did not hear anything from [place] a few days after the 5/1 date to be sure if we were open.

## 2019-04-22 NOTE — Therapy (Signed)
[place], [place], [address] Phone: [phone]   Fax:  [phone]  Physical Therapy Treatment/Discharge  Patient Details  Name: Mary Ray MRN: [id number] Date of Birth: 07/16/1976 Referring Provider (PT): Edmonia Lynch, MD   Encounter Date: 10/31/2018    Past Medical History:  Diagnosis Date  . Arthritis    Back    Past Surgical History:  Procedure Laterality Date  . ABDOMINAL HYSTERECTOMY [name] 10/19/2015   Procedure:  HYSTERECTOMY ABDOMINAL WITH RIGHT SALPINGECTOMYand left oophorectomy;  Surgeon: Molli Posey, MD;  Location: [facility];  Service: Gynecology;  Laterality: [name];  . BREAST REDUCTION SURGERY    . CESAREAN SECTION  08/16/2011   Procedure: CESAREAN SECTION;  Surgeon: Darlyn Chamber;  Location: [facility];  Service: Gynecology;  Laterality: [name];  . CHOLECYSTECTOMY [name] 09/29/2017   Procedure: LAPAROSCOPIC CHOLECYSTECTOMY WITH INTRAOPERATIVE CHOLANGIOGRAM;  Surgeon: Armandina Gemma, MD;  Location: [facility];  Service: General;  Laterality: [name];  . DIAGNOSTIC LAPAROSCOPY    . KNEE ARTHROSCOPY WITH MEDIAL MENISECTOMY Right 08/14/2018   Procedure: RIGHT KNEE ARTHROSCOPY WITH MEDIAL MENISECTOMY AND CHONDROPLASTY;  Surgeon: Renette Butters, MD;  Location: [facility];  Service: Orthopedics;  Laterality: Right;  . MOUTH SURGERY    . SALPINGECTOMY  2004   Scar tissue???  . WISDOM TOOTH EXTRACTION      There were no vitals filed for this visit.                              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, dishes, lifting activities    Baseline  reviewed today, met    Time  3    Period   Weeks    Status  Achieved    Target Date  11/06/18      PT SHORT TERM GOAL #3   Title  Tolerate standing 10-15 minutes at work with LBP 5/10 or less    Baseline  met for back (still limited by knee pain)    Time  3    Period  Weeks    Status  Achieved    Target Date  11/06/18        PT Long Term Goals - 10/31/18 1507      PT LONG TERM GOAL #1   Title  Improve FOTO outcome measure score to 35% or less impairment    Baseline  50% limited at eval, not retested today    Time  6    Period  Weeks    Status  On-going      PT LONG TERM GOAL #2   Title  Increase right LE strength grossly 1/2 MMT grade or greater to improve ability to navigate steps to apartment and for lifting for chores    Baseline  grossly 4/5    Time  6    Period  Weeks    Status  Not Met      PT LONG TERM GOAL #3  Title  Increase trunk flexion AROM 10 deg or greater to improve ability to tie shoes, perform bending for chores    Baseline  70 deg at eval, not retested today    Time  6    Period  Weeks    Status  On-going      PT LONG TERM GOAL #4   Title  Tolerate standing, ambulation for work duties and activities such as grocery shopping periods at least 30-40 minutes with pain 5/10 or less    Baseline  diffficulty tolerating due to pain    Time  6    Period  Weeks    Status  On-going              Patient will benefit from skilled therapeutic intervention in order to improve the following deficits and impairments:  Obesity, Impaired flexibility, Difficulty walking, Decreased activity tolerance, Decreased range of motion, Decreased strength, Pain, Improper body mechanics, Decreased mobility  Visit Diagnosis: Chronic midline low back pain without sciatica  Muscle weakness (generalized)     Problem List Patient Active Problem List   Diagnosis Date Noted  . Cholelithiasis with chronic cholecystitis 09/26/2017  . Menorrhagia 10/19/2015  . Threatened preterm labor, antepartum 06/18/2011     PHYSICAL THERAPY DISCHARGE SUMMARY  Visits from Start of Care: 4  Current functional level related to goals / functional outcomes: Status unknown: patient did not return for further therapy after last visit 10/31/18.   Remaining deficits: Unknown   Education / Equipment: NA Plan:                                                    Patient goals were not met. Patient is being discharged due to not returning since the last visit.  ?????          Beaulah Dinning, PT, DPT 04/22/19 10:32 AM    [facility] [address] [place], [place], [address] Phone: [phone]   Fax:  [phone]  Name: Mary Ray MRN: [id number] Date of Birth: September 30, 1975

## 2019-10-26 ENCOUNTER — Ambulatory Visit: Payer: BC Managed Care – PPO | Attending: Internal Medicine

## 2019-10-26 DIAGNOSIS — Z23 Encounter for immunization: Secondary | ICD-10-CM

## 2019-10-26 NOTE — Progress Notes (Signed)
   Covid-19 Vaccination Clinic  Name:  Mary Ray    MRN: [id number] DOB: Aug 08, 1976  10/26/2019  [name] was observed post Covid-19 immunization for 30 minutes based on pre-vaccination screening without incidence. She was provided with Vaccine Information Sheet and instruction to access the V-Safe system.   [name] was instructed to call 911 with any severe reactions post vaccine: Marland Kitchen Difficulty breathing  . Swelling of your face and throat  . A fast heartbeat  . A bad rash all over your body  . Dizziness and weakness    Immunizations Administered    Name Date Dose VIS Date Route   Pfizer COVID-19 Vaccine 10/26/2019  5:06 PM 0.3 mL 08/09/2019 Intramuscular   Manufacturer: [place]   Lot: [id number]   [place]: [id number]

## 2019-11-16 ENCOUNTER — Ambulatory Visit: Payer: BC Managed Care – PPO | Attending: Internal Medicine

## 2019-11-16 DIAGNOSIS — Z23 Encounter for immunization: Secondary | ICD-10-CM

## 2019-11-16 NOTE — Progress Notes (Signed)
   Covid-19 Vaccination Clinic  Name:  Mary Ray    MRN: [id number] DOB: 1975-10-26  11/16/2019  Mary Ray was observed post Covid-19 immunization for 30 minutes based on pre-vaccination screening without incident. She was provided with Vaccine Information Sheet and instruction to access the V-Safe system.   Mary Ray was instructed to call 911 with any severe reactions post vaccine: Marland Kitchen Difficulty breathing  . Swelling of face and throat  . A fast heartbeat  . A bad rash all over body  . Dizziness and weakness   Immunizations Administered    Name Date Dose VIS Date Route   Pfizer COVID-19 Vaccine 11/16/2019 10:00 AM 0.3 mL 08/09/2019 Intramuscular   Manufacturer: [place]   Lot: [id number]   [place]: [id number]

## 2020-04-01 ENCOUNTER — Other Ambulatory Visit: Payer: Self-pay

## 2020-04-01 ENCOUNTER — Ambulatory Visit (HOSPITAL_COMMUNITY)
Admission: EM | Admit: 2020-04-01 | Discharge: 2020-04-01 | Disposition: A | Discharge status: Home / Self Care | Payer: BC Managed Care – PPO | Attending: Family Medicine | Admitting: Family Medicine

## 2020-04-01 ENCOUNTER — Encounter (HOSPITAL_COMMUNITY): Payer: Self-pay

## 2020-04-01 DIAGNOSIS — Z1152 Encounter for screening for COVID-19: Secondary | ICD-10-CM | POA: Insufficient documentation

## 2020-04-01 LAB — SARS CORONAVIRUS 2 (TAT 6-24 HRS): SARS Coronavirus 2: POSITIVE — AB

## 2020-04-01 NOTE — ED Provider Notes (Signed)
[place]    CSN: [id number] Arrival date & time: 04/01/20  0950      History   Chief Complaint Chief Complaint  Patient presents with  . covid testing    HPI Mary Ray is a 44 y.o. female.   Mary Ray presents with requests for covid screening. No symptoms. Fully covid-19 vaccinated. Daughter has cough- presumably allergic- and requires a covid test in order to see her pediatrician. Mary Ray would like to be screened as well, in order to better encourage her daughter to participate with testing. No acute complaints today.    ROS per HPI, negative if not otherwise mentioned.      Past Medical History:  Diagnosis Date  . Arthritis    Back    Patient Active Problem List   Diagnosis Date Noted  . Cholelithiasis with chronic cholecystitis 09/26/2017  . Menorrhagia 10/19/2015  . Threatened preterm labor, antepartum 06/18/2011    Past Surgical History:  Procedure Laterality Date  . ABDOMINAL HYSTERECTOMY [name] 10/19/2015   Procedure:  HYSTERECTOMY ABDOMINAL WITH RIGHT SALPINGECTOMYand left oophorectomy;  Surgeon: Molli Posey, MD;  Location: [facility];  Service: Gynecology;  Laterality: [name];  . BREAST REDUCTION SURGERY    . CESAREAN SECTION  08/16/2011   Procedure: CESAREAN SECTION;  Surgeon: Darlyn Chamber;  Location: [facility];  Service: Gynecology;  Laterality: [name];  . CHOLECYSTECTOMY [name] 09/29/2017   Procedure: LAPAROSCOPIC CHOLECYSTECTOMY WITH INTRAOPERATIVE CHOLANGIOGRAM;  Surgeon: Armandina Gemma, MD;  Location: [facility];  Service: General;  Laterality: [name];  . DIAGNOSTIC LAPAROSCOPY    . KNEE ARTHROSCOPY WITH MEDIAL MENISECTOMY Right 08/14/2018   Procedure: RIGHT KNEE ARTHROSCOPY WITH MEDIAL MENISECTOMY AND CHONDROPLASTY;  Surgeon: Renette Butters, MD;  Location: [facility];  Service: Orthopedics;  Laterality: Right;  . MOUTH SURGERY    . SALPINGECTOMY  2004   Scar tissue???  . WISDOM TOOTH EXTRACTION      OB History    Gravida  1   Para  1    Term  1   Preterm  0   AB  0   Living  1     SAB  0   TAB  0   Ectopic  0   Multiple  0   Live Births  1            Home Medications    Prior to Admission medications   Medication Sig Start Date End Date Taking? Authorizing Provider  aspirin EC 81 MG tablet Take 1 tablet (81 mg total) by mouth 2 (two) times daily. For DVT prophylaxis for 30 days after surgery. Patient not taking: Reported on 10/16/2018 08/14/18   Prudencio Burly III, PA-C  EPINEPHrine (EPIPEN) 0.3 mg/0.3 mL IJ SOAJ injection Inject 0.3 mLs (0.3 mg total) into the muscle once. 03/13/14   Mariel Aloe, MD  ondansetron (ZOFRAN) 4 MG tablet Take 1 tablet (4 mg total) by mouth every 8 (eight) hours as needed for nausea or vomiting. Patient not taking: Reported on 10/16/2018 08/14/18   Prudencio Burly III, PA-C    Family History Family History  Problem Relation Age of Onset  . Diabetes Mother   . Hypertension Mother   . Diabetes Father   . Stroke Father   . Hypertension Father   . Hypertension Sister   . Learning disabilities Cousin        autism  . Anesthesia problems Neg Hx   . Hypotension Neg Hx   . Malignant  hyperthermia Neg Hx   . Pseudochol deficiency Neg Hx   . Breast cancer Neg Hx     Social History Social History   Tobacco Use  . Smoking status: Never Smoker  . Smokeless tobacco: Never Used  Vaping Use  . Vaping Use: Never used  Substance Use Topics  . Alcohol use: No  . Drug use: No     Allergies   Vioxx [rofecoxib], Clindamycin/lincomycin, Naproxen, and Tramadol   Review of Systems Review of Systems   Physical Exam Triage Vital Signs ED Triage Vitals [04/01/20 1043]  Enc Vitals Group     BP 135/72     Pulse Rate 89     Resp      Temp 98.2 F (36.8 C)     Temp Source Oral     SpO2 100 %     Weight      Height      Head Circumference      Peak Flow      Pain Score      Pain Loc      Pain Edu?      Excl. in [place]?    No data  found.  Updated Vital Signs BP 135/72 (BP Location: Right Arm)   Pulse 89   Temp 98.2 F (36.8 C) (Oral)   LMP 09/28/2015 (Exact Date)   SpO2 100%    Physical Exam Constitutional:      General: She is not in acute distress.    Appearance: She is well-developed.  Cardiovascular:     Rate and Rhythm: Normal rate.  Pulmonary:     Effort: Pulmonary effort is normal.  Skin:    General: Skin is warm and dry.  Neurological:     Mental Status: She is alert and oriented to person, place, and time.      UC Treatments / Results  Labs (all labs ordered are listed, but only abnormal results are displayed) Labs Reviewed  SARS CORONAVIRUS 2 (TAT 6-24 HRS)    EKG   Radiology No results found.  Procedures Procedures (including critical care time)  Medications Ordered in UC Medications - No data to display  Initial Impression / Assessment and Plan / UC Course  I have reviewed the triage vital signs and the nursing notes.  Pertinent labs & imaging results that were available during my care of the patient were reviewed by me and considered in my medical decision making (see chart for details).    Vaccinated. No symptoms, no known exposures. screening pending.  Final Clinical Impressions(s) / UC Diagnoses   Final diagnoses:  Encounter for screening for COVID-19     Discharge Instructions      Will notify you by phone of any positive findings. Your negative results will be sent through your MyChart.        ED Prescriptions    None     PDMP not reviewed this encounter.   Zigmund Gottron, NP 04/01/20 1119

## 2020-04-01 NOTE — ED Triage Notes (Signed)
Pt presents to UC for covid testing. Pt states daughter is having asthma s/s needs to be seen by pediatrician and can't be until negative covid test. Pt states I'm being tested so my daughter will be comfortable with it. Pt denies symptoms or acute complaints at this time.

## 2020-04-01 NOTE — Discharge Instructions (Signed)
  Will notify you by phone of any positive findings. Your negative results will be sent through your MyChart.

## 2021-04-01 ENCOUNTER — Ambulatory Visit (HOSPITAL_COMMUNITY)
Admission: EM | Admit: 2021-04-01 | Discharge: 2021-04-01 | Disposition: A | Discharge status: Home / Self Care | Payer: BC Managed Care – PPO | Attending: Family Medicine | Admitting: Family Medicine

## 2021-04-01 ENCOUNTER — Other Ambulatory Visit: Payer: Self-pay

## 2021-04-01 ENCOUNTER — Encounter (HOSPITAL_COMMUNITY): Payer: Self-pay

## 2021-04-01 DIAGNOSIS — B349 Viral infection, unspecified: Secondary | ICD-10-CM

## 2021-04-01 LAB — POCT RAPID STREP A, ED / UC: Streptococcus, Group A Screen (Direct): NEGATIVE

## 2021-04-01 MED ORDER — GUAIFENESIN ER 600 MG PO TB12: 1200.0000 mg | ORAL_TABLET | Freq: Two times a day (BID) | ORAL | 0 refills | Status: DC

## 2021-04-01 MED ORDER — ACETAMINOPHEN 325 MG PO TABS
ORAL_TABLET | ORAL | Status: AC
  Filled 2021-04-01: qty 2

## 2021-04-01 MED ORDER — BENZONATATE 100 MG PO CAPS: 100.0000 mg | ORAL_CAPSULE | Freq: Three times a day (TID) | ORAL | 0 refills | Status: DC

## 2021-04-01 MED ORDER — DEXTROMETHORPHAN HBR 15 MG PO TABS: 15.0000 mg | ORAL_TABLET | Freq: Four times a day (QID) | ORAL | 0 refills | Status: DC | PRN

## 2021-04-01 MED ORDER — FLUTICASONE PROPIONATE 50 MCG/ACT NA SUSP: 2.0000 | Freq: Every day | NASAL | 2 refills | Status: AC

## 2021-04-01 MED ORDER — ACETAMINOPHEN 325 MG PO TABS
650.0000 mg | ORAL_TABLET | Freq: Once | ORAL | Status: AC
  Administered 2021-04-01: 650 mg via ORAL

## 2021-04-01 NOTE — ED Triage Notes (Signed)
Pt presents with nasal congestion, cough and states she feels mucus in the back of her throat.   States she feels dehydrated. States she feels tired.

## 2021-04-01 NOTE — Discharge Instructions (Addendum)
Your strep test today was negative  Her symptoms are most likely being caused by a virus therefore it will self resolve within the next 7 to 14 days, you can use medicines in attempt to make you feel better during this time.  Can use Mucinex twice a day to help with congestion  Illness dextromethorphan every 6 hours as needed to help with cough  Can use Tessalon every 8 hours as needed to help with cough and  Continue to drink water, in addition add Gatorade or similar product to help maintain hydration  Can use 2 sprays of Flonase every morning to help clear sinus passages  Can use over-the-counter ibuprofen or Tylenol, which ever you have at home, to help with fever management

## 2021-04-01 NOTE — ED Provider Notes (Signed)
[place]    CSN: [id number] Arrival date & time: 04/01/21  1239      History   Chief Complaint No chief complaint on file.   HPI Mary Ray is a 45 y.o. female.   Patient presents with nasal congestion, postnasal drip, mild sore throat and nonproductive cough for 3 to 4 days.  Dors is fatigue and feeling of being dehydrated.  Able to tolerate food and liquids, mainly drinking water.  Denies known fever, chills, body aches, headache, ear pain or fullness, shortness of breath, chest pain or tightness, abdominal pain, nausea, vomiting, diarrhea.,  Ear pain or fullness.  Exposed to strep throat last week.  Home COVID test negative.  Past Medical History:  Diagnosis Date   Arthritis    Back    Patient Active Problem List   Diagnosis Date Noted   Cholelithiasis with chronic cholecystitis 09/26/2017   Menorrhagia 10/19/2015   Threatened preterm labor, antepartum 06/18/2011    Past Surgical History:  Procedure Laterality Date   ABDOMINAL HYSTERECTOMY [name] 10/19/2015   Procedure:  HYSTERECTOMY ABDOMINAL WITH RIGHT SALPINGECTOMYand left oophorectomy;  Surgeon: Molli Posey, MD;  Location: [facility];  Service: Gynecology;  Laterality: [name];   BREAST REDUCTION SURGERY     CESAREAN SECTION  08/16/2011   Procedure: CESAREAN SECTION;  Surgeon: Darlyn Chamber;  Location: [facility];  Service: Gynecology;  Laterality: [name];   CHOLECYSTECTOMY [name] 09/29/2017   Procedure: LAPAROSCOPIC CHOLECYSTECTOMY WITH INTRAOPERATIVE CHOLANGIOGRAM;  Surgeon: Armandina Gemma, MD;  Location: [facility];  Service: General;  Laterality: [name];   DIAGNOSTIC LAPAROSCOPY     KNEE ARTHROSCOPY WITH MEDIAL MENISECTOMY Right 08/14/2018   Procedure: RIGHT KNEE ARTHROSCOPY WITH MEDIAL MENISECTOMY AND CHONDROPLASTY;  Surgeon: Renette Butters, MD;  Location: [facility];  Service: Orthopedics;  Laterality: Right;   MOUTH SURGERY     SALPINGECTOMY  2004   Scar tissue???   WISDOM TOOTH EXTRACTION      OB History      Gravida  1   Para  1   Term  1   Preterm  0   AB  0   Living  1      SAB  0   IAB  0   Ectopic  0   Multiple  0   Live Births  1            Home Medications    Prior to Admission medications   Medication Sig Start Date End Date Taking? Authorizing Provider  benzonatate (TESSALON) 100 MG capsule Take 1 capsule (100 mg total) by mouth every 8 (eight) hours. 04/01/21  Yes Roxan Yamamoto, Leitha Schuller, NP  Dextromethorphan HBr 15 MG TABS Take 15 mg by mouth 4 (four) times daily as needed. 04/01/21  Yes Zaccheus Edmister R, NP  fluticasone (FLONASE) 50 MCG/ACT nasal spray Place 2 sprays into both nostrils daily. 04/01/21  Yes Anushree Dorsi R, NP  guaiFENesin (MUCINEX) 600 MG 12 hr tablet Take 2 tablets (1,200 mg total) by mouth 2 (two) times daily. 04/01/21  Yes Hans Eden, NP  aspirin EC 81 MG tablet Take 1 tablet (81 mg total) by mouth 2 (two) times daily. For DVT prophylaxis for 30 days after surgery. Patient not taking: Reported on 10/16/2018 08/14/18   Prudencio Burly III, PA-C  EPINEPHrine (EPIPEN) 0.3 mg/0.3 mL IJ SOAJ injection Inject 0.3 mLs (0.3 mg total) into the muscle once. 03/13/14   Mariel Aloe, MD  ondansetron (ZOFRAN) 4 MG tablet Take  1 tablet (4 mg total) by mouth every 8 (eight) hours as needed for nausea or vomiting. Patient not taking: Reported on 10/16/2018 08/14/18   Prudencio Burly III, PA-C    Family History Family History  Problem Relation Age of Onset   Diabetes Mother    Hypertension Mother    Diabetes Father    Stroke Father    Hypertension Father    Hypertension Sister    Learning disabilities Cousin        autism   Anesthesia problems Neg Hx    Hypotension Neg Hx    Malignant hyperthermia Neg Hx    Pseudochol deficiency Neg Hx    Breast cancer Neg Hx     Social History Social History   Tobacco Use   Smoking status: Never   Smokeless tobacco: Never  Vaping Use   Vaping Use: Never used  Substance Use Topics   Alcohol  use: No   Drug use: No     Allergies   Vioxx [rofecoxib], Clindamycin/lincomycin, Naproxen, and Tramadol   Review of Systems Review of Systems Defer to HPI   Physical Exam Triage Vital Signs ED Triage Vitals  Enc Vitals Group     BP 04/01/21 1328 (!) 145/86     Pulse Rate 04/01/21 1328 (!) 121     Resp 04/01/21 1328 18     Temp 04/01/21 1328 (!) 103.2 F (39.6 C)     Temp Source 04/01/21 1328 Oral     SpO2 04/01/21 1328 96 %     Weight --      Height --      Head Circumference --      Peak Flow --      Pain Score 04/01/21 1327 0     Pain Loc --      Pain Edu? --      Excl. in [place]? --    No data found.  Updated Vital Signs BP (!) 145/86 (BP Location: Right Arm)   Pulse (!) 121   Temp (!) 103.2 F (39.6 C) (Oral)   Resp 18   LMP 09/28/2015 (Exact Date)   SpO2 96%   Visual Acuity Right Eye Distance:   Left Eye Distance:   Bilateral Distance:    Right Eye Near:   Left Eye Near:    Bilateral Near:     Physical Exam Constitutional:      Appearance: Normal appearance. She is obese.  HENT:     Head: Normocephalic.     Right Ear: Hearing, ear canal and external ear normal. A middle ear effusion is present.     Left Ear: Hearing, ear canal and external ear normal. A middle ear effusion is present.     Nose: Congestion present. No rhinorrhea.     Mouth/Throat:     Mouth: Mucous membranes are moist.     Pharynx: Posterior oropharyngeal erythema present.  Eyes:     Extraocular Movements: Extraocular movements intact.     Conjunctiva/sclera: Conjunctivae normal.     Pupils: Pupils are equal, round, and reactive to light.  Cardiovascular:     Rate and Rhythm: Regular rhythm. Tachycardia present.     Pulses: Normal pulses.     Heart sounds: Normal heart sounds.  Pulmonary:     Effort: Pulmonary effort is normal.     Breath sounds: Normal breath sounds.  Musculoskeletal:     Cervical back: Normal range of motion and neck supple.  Skin:    General: Skin is  warm.  Neurological:     Mental Status: She is alert and oriented to person, place, and time. Mental status is at baseline.  Psychiatric:        Mood and Affect: Mood normal.        Behavior: Behavior normal.     UC Treatments / Results  Labs (all labs ordered are listed, but only abnormal results are displayed) Labs Reviewed  POCT RAPID STREP A, ED / UC    EKG   Radiology No results found.  Procedures Procedures (including critical care time)  Medications Ordered in UC Medications  acetaminophen (TYLENOL) tablet 650 mg (650 mg Oral Given 04/01/21 1333)    Initial Impression / Assessment and Plan / UC Course  I have reviewed the triage vital signs and the nursing notes.  Pertinent labs & imaging results that were available during my care of the patient were reviewed by me and considered in my medical decision making (see chart for details).  Viral illness  1.  Rapid strep negative 2.  Tylenol 650 mg now, fever reduced to 101, manual pulse taken, pulse in the low 100s 3.  Tessalon 100 mg 3 times daily. 4. Dextromethorphan 15 mg 4 times a day. 5.  Mucinex 1200 mg twice daily prn 6.  Flonase 2 sprays each nare daily 7.  Over-the-counter Tylenol ibuprofen for fever management Final Clinical Impressions(s) / UC Diagnoses   Final diagnoses:  Viral illness     Discharge Instructions      Your strep test today was negative  Her symptoms are most likely being caused by a virus therefore it will self resolve within the next 7 to 14 days, you can use medicines in attempt to make you feel better during this time.  Can use Mucinex twice a day to help with congestion  Illness dextromethorphan every 6 hours as needed to help with cough  Can use Tessalon every 8 hours as needed to help with cough and  Continue to drink water, in addition add Gatorade or similar product to help maintain hydration  Can use 2 sprays of Flonase every morning to help clear sinus  passages  Can use over-the-counter ibuprofen or Tylenol, which ever you have at home, to help with fever management   ED Prescriptions     Medication Sig Dispense Auth. Provider   guaiFENesin (MUCINEX) 600 MG 12 hr tablet Take 2 tablets (1,200 mg total) by mouth 2 (two) times daily. 30 tablet Nimo Verastegui R, NP   fluticasone (FLONASE) 50 MCG/ACT nasal spray Place 2 sprays into both nostrils daily. 9.9 mL Yoseline Andersson R, NP   benzonatate (TESSALON) 100 MG capsule Take 1 capsule (100 mg total) by mouth every 8 (eight) hours. 21 capsule Jaylene Arrowood R, NP   Dextromethorphan HBr 15 MG TABS Take 15 mg by mouth 4 (four) times daily as needed. 60 tablet Charlann Wayne, Leitha Schuller, NP      PDMP not reviewed this encounter.   Hans Eden, NP 04/01/21 1511

## 2022-02-03 ENCOUNTER — Encounter (INDEPENDENT_AMBULATORY_CARE_PROVIDER_SITE_OTHER): Payer: Self-pay

## 2022-03-24 ENCOUNTER — Encounter (INDEPENDENT_AMBULATORY_CARE_PROVIDER_SITE_OTHER): Payer: Self-pay | Admitting: Bariatrics

## 2022-03-24 ENCOUNTER — Ambulatory Visit (INDEPENDENT_AMBULATORY_CARE_PROVIDER_SITE_OTHER): Payer: BC Managed Care – PPO | Admitting: Bariatrics

## 2022-03-24 VITALS — BP 118/74 | HR 76 | Temp 98.2°F | Ht 68.0 in | Wt 335.0 lb

## 2022-03-24 DIAGNOSIS — R5383 Other fatigue: Secondary | ICD-10-CM | POA: Diagnosis not present

## 2022-03-24 DIAGNOSIS — Z6841 Body Mass Index (BMI) 40.0 and over, adult: Secondary | ICD-10-CM | POA: Insufficient documentation

## 2022-03-24 DIAGNOSIS — Z1331 Encounter for screening for depression: Secondary | ICD-10-CM

## 2022-03-24 DIAGNOSIS — I10 Essential (primary) hypertension: Secondary | ICD-10-CM

## 2022-03-24 DIAGNOSIS — R0602 Shortness of breath: Secondary | ICD-10-CM | POA: Diagnosis not present

## 2022-03-24 DIAGNOSIS — E559 Vitamin D deficiency, unspecified: Secondary | ICD-10-CM

## 2022-03-24 DIAGNOSIS — Z0000 Encounter for general adult medical examination without abnormal findings: Secondary | ICD-10-CM | POA: Insufficient documentation

## 2022-03-24 DIAGNOSIS — R7309 Other abnormal glucose: Secondary | ICD-10-CM

## 2022-03-24 DIAGNOSIS — Z1211 Encounter for screening for malignant neoplasm of colon: Secondary | ICD-10-CM | POA: Insufficient documentation

## 2022-03-25 LAB — LIPID PANEL WITH LDL/HDL RATIO
Cholesterol, Total: 160 mg/dL (ref 100–199)
HDL: 41 mg/dL (ref >39)
LDL Chol Calc (NIH): 107 mg/dL — ABNORMAL HIGH (ref 0–99)
LDL/HDL Ratio: 2.6 ratio (ref 0.0–3.2)
Triglycerides: 60 mg/dL (ref 0–149)
VLDL Cholesterol Cal: 12 mg/dL (ref 5–40)

## 2022-03-25 LAB — COMPREHENSIVE METABOLIC PANEL
ALT: 12 IU/L (ref 0–32)
AST: 14 IU/L (ref 0–40)
Albumin/Globulin Ratio: 1.3 (ref 1.2–2.2)
Albumin: 3.9 g/dL (ref 3.9–4.9)
Alkaline Phosphatase: 57 IU/L (ref 44–121)
BUN/Creatinine Ratio: 12 (ref 9–23)
BUN: 9 mg/dL (ref 6–24)
Bilirubin Total: 0.3 mg/dL (ref 0.0–1.2)
CO2: 23 mmol/L (ref 20–29)
Calcium: 9.2 mg/dL (ref 8.7–10.2)
Chloride: 102 mmol/L (ref 96–106)
Creatinine, Ser: 0.77 mg/dL (ref 0.57–1.00)
Globulin, Total: 3.1 g/dL (ref 1.5–4.5)
Glucose: 75 mg/dL (ref 70–99)
Potassium: 4.2 mmol/L (ref 3.5–5.2)
Sodium: 140 mmol/L (ref 134–144)
Total Protein: 7 g/dL (ref 6.0–8.5)
eGFR: 97 mL/min/{1.73_m2} (ref >59)

## 2022-03-25 LAB — INSULIN, RANDOM: INSULIN: 11.6 u[IU]/mL (ref 2.6–24.9)

## 2022-03-25 LAB — HEMOGLOBIN A1C
Est. average glucose Bld gHb Est-mCnc: 117 mg/dL
Hgb A1c MFr Bld: 5.7 % — ABNORMAL HIGH (ref 4.8–5.6)

## 2022-03-25 LAB — TSH+T4F+T3FREE
Free T4: 1.42 ng/dL (ref 0.82–1.77)
T3, Free: 2.7 pg/mL (ref 2.0–4.4)
TSH: 1.16 u[IU]/mL (ref 0.450–4.500)

## 2022-03-25 LAB — VITAMIN D 25 HYDROXY (VIT D DEFICIENCY, FRACTURES): Vit D, 25-Hydroxy: 15.9 ng/mL — ABNORMAL LOW (ref 30.0–100.0)

## 2022-03-28 ENCOUNTER — Encounter (INDEPENDENT_AMBULATORY_CARE_PROVIDER_SITE_OTHER): Payer: Self-pay | Admitting: Bariatrics

## 2022-03-28 DIAGNOSIS — R7303 Prediabetes: Secondary | ICD-10-CM | POA: Insufficient documentation

## 2022-04-05 NOTE — Progress Notes (Unsigned)
Chief Complaint:   OBESITY Mary Ray (MR# [id number]) is a 46 y.o. female who presents for evaluation and treatment of obesity and related comorbidities. Current BMI is Body mass index is 50.94 kg/m. Mary Ray has been struggling with her weight for many years and has been unsuccessful in either losing weight, maintaining weight loss, or reaching her healthy weight goal.  Mary Ray states that she likes to cook but notes wasting food products as an obstacle.  She craves [place] salad.  She considers herself a picky eater.  Mary Ray is currently in the action stage of change and ready to dedicate time achieving and maintaining a healthier weight. Mary Ray is interested in becoming our patient and working on intensive lifestyle modifications including (but not limited to) diet and exercise for weight loss.  Mary Ray's habits were reviewed today and are as follows: Her family eats meals together, she thinks her family will eat healthier with her, her desired weight loss is 50 lbs, she started gaining weight when she became sexually active and on birth control, her heaviest weight ever was 365 pounds, she is a picky eater and doesn't like to eat healthier foods, she has significant food cravings issues, she skips meals frequently, and she is frequently drinking liquids with calories.  Depression Screen Mary Ray's Food and Mood (modified PHQ-9) score was 3.     03/24/2022    8:58 AM  Depression screen PHQ 2/9  Decreased Interest 1  Down, Depressed, Hopeless 0  PHQ - 2 Score 1  Altered sleeping 0  Tired, decreased energy 1  Change in appetite 1  Feeling bad or failure about yourself  0  Trouble concentrating 0  Moving slowly or fidgety/restless 0  Suicidal thoughts 0  PHQ-9 Score 3  Difficult doing work/chores Somewhat difficult   Subjective:   1. Other fatigue Mary Ray admits to daytime somnolence and admits to waking up still tired. Patient has a history of symptoms of daytime  fatigue. Mary Ray generally gets 6 or 7 hours of sleep per night, and states that she has generally restful sleep. Snoring is present. Apneic episodes are not present. Epworth Sleepiness Score is 13.   2. SOB (shortness of breath) on exertion Mary Ray notes increasing shortness of breath with exercising and seems to be worsening over time with weight gain. She notes getting out of breath sooner with activity than she used to. This has not gotten worse recently. Mary Ray denies shortness of breath at rest or orthopnea.  3. Elevated glucose Mary Ray is currently taking Ozempic.  4. Vitamin D deficiency Mary Ray is not on vitamin D currently.  5. Health care maintenance Obesity with comorbidity.   6. Essential hypertension Mary Ray is taking losartan-HCTZ, and her blood pressure is controlled.  Assessment/Plan:   1. Other fatigue Mary Ray does feel that her weight is causing her energy to be lower than it should be. Fatigue may be related to obesity, depression or many other causes. Labs will be ordered, and in the meanwhile, [name] will focus on self care including making healthy food choices, increasing physical activity and focusing on stress reduction.  - EKG 12-Lead - TSH+T4F+T3Free  2. SOB (shortness of breath) on exertion Mary Ray does feel that she gets out of breath more easily that she used to when she exercises. Mary Ray's shortness of breath appears to be obesity related and exercise induced. She has agreed to work on weight loss and gradually increase exercise to treat her exercise induced shortness of breath. Will continue to monitor  closely.  - TSH+T4F+T3Free  3. Elevated glucose [name], and we will follow-up at [facility] next visit.  - Hemoglobin A1c - Insulin, random - Lipid Panel With LDL/HDL Ratio  4. Vitamin D deficiency [name], and we will follow-[facility] at [facility] next visit.  - VITAMIN D 25 Hydroxy (Vit-D Deficiency,  Fractures)  5. Health care maintenance [name], and we will follow-up at [facility] next visit.  - Comprehensive metabolic panel - Lipid Panel With LDL/HDL Ratio  6. Essential hypertension [name], and Mary Ray will continue her medications as directed. We will follow-up at her next visit.  - Comprehensive metabolic panel - Lipid Panel With LDL/HDL Ratio  7. Depression screening Mary Ray had a positive depression screening. Depression is commonly associated with obesity and often results in emotional eating behaviors. We will monitor this closely and work on CBT to help improve the non-hunger eating patterns. Referral to Psychology may be required if no improvement is seen as she continues in our clinic.  8. Class 3 severe obesity with serious comorbidity and body mass index (BMI) of 50.0 to 59.9 in adult, unspecified obesity type (HCC) Mary Ray is currently in the action stage of change and her goal is to continue with weight loss efforts. I recommend Mary Ray begin the structured treatment plan as follows:  She has agreed to the Category 3 Plan.  Mary Ray will not skip meals and meal planning was discussed.  She will eliminate sugary drinks.  Exercise goals: No exercise has been prescribed at this time.   Behavioral modification strategies: increasing lean protein intake, decreasing simple carbohydrates, increasing vegetables, increasing water intake, decreasing liquid calories, decreasing eating out, no skipping meals, meal planning and cooking strategies, keeping healthy foods in the home, and planning for success.  She was informed of the importance of frequent follow-up visits to maximize her success with intensive lifestyle modifications for her multiple health conditions. She was informed we would discuss her lab results at her next visit unless there is a critical issue that needs to be addressed sooner. Mary Ray agreed to keep her next visit at the  agreed upon time to discuss these results.  Objective:   Blood pressure 118/74, pulse 76, temperature 98.2 F (36.8 C), height '5\' 8"'$  (1.727 m), weight (!) 335 lb (152 kg), last menstrual period 09/28/2015, SpO2 96 %. Body mass index is 50.94 kg/m.  EKG: Normal sinus rhythm, rate 68 BPM.  Indirect Calorimeter completed today shows a VO2 of 324 and a REE of 2232.  Her calculated basal metabolic rate is 4431 thus her basal metabolic rate is worse than expected.  General: Cooperative, alert, well developed, in no acute distress. HEENT: Conjunctivae and lids unremarkable. Cardiovascular: Regular rhythm.  Lungs: Normal work of breathing. Neurologic: No focal deficits.   Lab Results  Component Value Date   CREATININE 0.77 03/24/2022   BUN 9 03/24/2022   NA 140 03/24/2022   K 4.2 03/24/2022   CL 102 03/24/2022   CO2 23 03/24/2022   Lab Results  Component Value Date   ALT 12 03/24/2022   AST 14 03/24/2022   ALKPHOS 57 03/24/2022   BILITOT 0.3 03/24/2022   Lab Results  Component Value Date   HGBA1C 5.7 (H) 03/24/2022   Lab Results  Component Value Date   INSULIN 11.6 03/24/2022   Lab Results  Component Value Date   TSH 1.160 03/24/2022   Lab Results  Component Value Date  CHOL 160 03/24/2022   HDL 41 03/24/2022   LDLCALC 107 (H) 03/24/2022   TRIG 60 03/24/2022   Lab Results  Component Value Date   WBC 5.4 08/07/2018   HGB 12.0 08/07/2018   HCT 37.5 08/07/2018   MCV 90.1 08/07/2018   PLT 420 (H) 08/07/2018   No results found for: "IRON", "TIBC", "FERRITIN"  Attestation Statements:   Reviewed by clinician on day of visit: allergies, medications, problem list, medical history, surgical history, family history, social history, and previous encounter notes.  Wilhemena Durie, am acting as Location manager for [employer], DO.  I have reviewed the above documentation for accuracy and completeness, and I agree with the above. Jearld Lesch, DO

## 2022-04-06 ENCOUNTER — Encounter (INDEPENDENT_AMBULATORY_CARE_PROVIDER_SITE_OTHER): Payer: Self-pay

## 2022-04-06 ENCOUNTER — Encounter (INDEPENDENT_AMBULATORY_CARE_PROVIDER_SITE_OTHER): Payer: Self-pay | Admitting: Bariatrics

## 2022-04-07 ENCOUNTER — Ambulatory Visit (INDEPENDENT_AMBULATORY_CARE_PROVIDER_SITE_OTHER): Payer: BC Managed Care – PPO | Admitting: Bariatrics

## 2022-04-07 ENCOUNTER — Encounter (INDEPENDENT_AMBULATORY_CARE_PROVIDER_SITE_OTHER): Payer: Self-pay | Admitting: Bariatrics

## 2022-04-07 VITALS — BP 132/84 | HR 90 | Temp 98.2°F | Ht 68.0 in | Wt 339.0 lb

## 2022-04-07 DIAGNOSIS — E559 Vitamin D deficiency, unspecified: Secondary | ICD-10-CM

## 2022-04-07 DIAGNOSIS — E669 Obesity, unspecified: Secondary | ICD-10-CM

## 2022-04-07 DIAGNOSIS — Z6841 Body Mass Index (BMI) 40.0 and over, adult: Secondary | ICD-10-CM

## 2022-04-07 DIAGNOSIS — R7303 Prediabetes: Secondary | ICD-10-CM | POA: Diagnosis not present

## 2022-04-07 MED ORDER — VITAMIN D (ERGOCALCIFEROL) 1.25 MG (50000 UNIT) PO CAPS: 50000.0000 [IU] | ORAL_CAPSULE | ORAL | 0 refills | Status: DC

## 2022-04-18 NOTE — Progress Notes (Unsigned)
Chief Complaint:   OBESITY Mary Ray is here to discuss her progress with her obesity treatment plan along with follow-up of her obesity related diagnoses. Mary Ray is on the Category 3 Plan and states she is following her eating plan approximately 0% of the time. Mary Ray states she is walking for 30 minutes 7 times per week.  Today's visit was #: 2 Starting weight: 335 lbs Starting date: 03/24/2022 Today's weight: 339 lbs Today's date: 04/07/2022 Total lbs lost to date: 0 Total lbs lost since last in-office visit: 0  Interim History: Mary Ray is up 4 pounds since her first visit.  She cut back on her portions.  She got her groceries yesterday.  Subjective:   1. Vitamin D deficiency Mary Ray's vitamin D level is 15.9, and she is not on vitamin D supplementation currently.  2. Prediabetes Mary Ray's Hgb A1c is 5.7.  She is not on medications currently.  Assessment/Plan:   1. Vitamin D deficiency Mary Ray agreed to start prescription vitamin D 50,000 units once weekly, with no refills.  - Vitamin D, Ergocalciferol, (DRISDOL) 1.25 MG (50000 UNIT) CAPS capsule; Take 1 capsule (50,000 Units total) by mouth every 7 (seven) days.  Dispense: 5 capsule; Refill: 0  2. Prediabetes Handouts on prediabetes and insulin resistance were given to the patient today.  3. Obesity, Current BMI 51.6 Mary Ray is currently in the action stage of change. As such, her goal is to continue with weight loss efforts. She has agreed to the Category 3 Plan and practicing portion control and making smarter food choices, such as increasing vegetables and decreasing simple carbohydrates.   She will adhere more closely to her plan 80-90%.  Reviewed labs with the patient from 03/24/2022, CMP, lipids, vitamin D.  Exercise goals: As is.   Behavioral modification strategies: increasing lean protein intake, decreasing simple carbohydrates, increasing vegetables, increasing water intake, decreasing eating out, no  skipping meals, meal planning and cooking strategies, keeping healthy foods in the home, and planning for success.  Mary Ray has agreed to follow-up with our clinic in 2 to 3 weeks. She was informed of the importance of frequent follow-up visits to maximize her success with intensive lifestyle modifications for her multiple health conditions.   Objective:   Blood pressure 132/84, pulse 90, temperature 98.2 F (36.8 C), height '5\' 8"'$  (1.727 m), weight (!) 339 lb (153.8 kg), last menstrual period 09/28/2015, SpO2 98 %. Body mass index is 51.54 kg/m.  General: Cooperative, alert, well developed, in no acute distress. HEENT: Conjunctivae and lids unremarkable. Cardiovascular: Regular rhythm.  Lungs: Normal work of breathing. Neurologic: No focal deficits.   Lab Results  Component Value Date   CREATININE 0.77 03/24/2022   BUN 9 03/24/2022   NA 140 03/24/2022   K 4.2 03/24/2022   CL 102 03/24/2022   CO2 23 03/24/2022   Lab Results  Component Value Date   ALT 12 03/24/2022   AST 14 03/24/2022   ALKPHOS 57 03/24/2022   BILITOT 0.3 03/24/2022   Lab Results  Component Value Date   HGBA1C 5.7 (H) 03/24/2022   Lab Results  Component Value Date   INSULIN 11.6 03/24/2022   Lab Results  Component Value Date   TSH 1.160 03/24/2022   Lab Results  Component Value Date   CHOL 160 03/24/2022   HDL 41 03/24/2022   LDLCALC 107 (H) 03/24/2022   TRIG 60 03/24/2022   Lab Results  Component Value Date   VD25OH 15.9 (L) 03/24/2022   Lab Results  Component Value Date   WBC 5.4 08/07/2018   HGB 12.0 08/07/2018   HCT 37.5 08/07/2018   MCV 90.1 08/07/2018   PLT 420 (H) 08/07/2018   No results found for: "IRON", "TIBC", "FERRITIN"  Attestation Statements:   Reviewed by clinician on day of visit: allergies, medications, problem list, medical history, surgical history, family history, social history, and previous encounter notes.   Wilhemena Durie, am acting as Location manager  for [employer], DO.  I have reviewed the above documentation for accuracy and completeness, and I agree with the above. Jearld Lesch, DO

## 2022-04-19 ENCOUNTER — Encounter (INDEPENDENT_AMBULATORY_CARE_PROVIDER_SITE_OTHER): Payer: Self-pay | Admitting: Bariatrics

## 2022-04-27 ENCOUNTER — Ambulatory Visit (INDEPENDENT_AMBULATORY_CARE_PROVIDER_SITE_OTHER): Payer: BC Managed Care – PPO | Admitting: Nurse Practitioner

## 2022-04-27 ENCOUNTER — Encounter (INDEPENDENT_AMBULATORY_CARE_PROVIDER_SITE_OTHER): Payer: Self-pay | Admitting: Nurse Practitioner

## 2022-04-27 VITALS — BP 129/83 | HR 79 | Temp 99.5°F | Ht 68.0 in | Wt 338.0 lb

## 2022-04-27 DIAGNOSIS — E559 Vitamin D deficiency, unspecified: Secondary | ICD-10-CM

## 2022-04-27 DIAGNOSIS — E669 Obesity, unspecified: Secondary | ICD-10-CM | POA: Diagnosis not present

## 2022-04-27 DIAGNOSIS — R7303 Prediabetes: Secondary | ICD-10-CM | POA: Diagnosis not present

## 2022-04-27 DIAGNOSIS — Z6841 Body Mass Index (BMI) 40.0 and over, adult: Secondary | ICD-10-CM | POA: Diagnosis not present

## 2022-04-27 MED ORDER — VITAMIN D (ERGOCALCIFEROL) 1.25 MG (50000 UNIT) PO CAPS: 50000.0000 [IU] | ORAL_CAPSULE | ORAL | 0 refills | Status: AC

## 2022-05-02 NOTE — Progress Notes (Unsigned)
Chief Complaint:   OBESITY Mary Ray is here to discuss her progress with her obesity treatment plan along with follow-up of her obesity related diagnoses. Mary Ray is on the Category 3 Plan and states she is following her eating plan approximately 50% of the time. Mary Ray states she is doing yoga 45 minutes 2-3 times per week.  Today's visit was #: 3 Starting weight: 335 lbs Starting date: 03/24/2022 Today's weight: 338 lbs Today's date: 04/27/2022 Total lbs lost to date: 0 Total lbs lost since last in-office visit: 1 lb  Interim History: likes the program.  Does not like eggs or yogurt. Drinking water, ginger ale and occasionally juice.  Notes that he walks daily daily.  Works 5 days per week. Took Ozempic in the past, was prescribed Wegovy, but never took.  Breakfast:  skipping Lunch:  healthy choice  Dinner:  protein and veggies Snacks:  fruit  Subjective:   1. Prediabetes Took Ozempic in the past.  Was prescribed Wegovy but has not started due to waiting for insurance approval.  Has been off [place] now for one month.  Does not want to take metformin due to possible side effects.  2. Vitamin D deficiency She is currently taking prescription vitamin D 50,000 IU each week. She denies nausea, vomiting or muscle weakness.  Assessment/Plan:   1. Prediabetes To contact PCP to discuss, since being off Ozempic for one month. I do not recommend starting Wegovy 1 mg now.   2. Vitamin D deficiency Side effects discussed.   Refill - Vitamin D, Ergocalciferol, (DRISDOL) 1.25 MG (50000 UNIT) CAPS capsule; Take 1 capsule (50,000 Units total) by mouth every 7 (seven) days.  Dispense: 5 capsule; Refill: 0  3. Obesity, Current BMI 51.4 Discussed traveling category 3 and LCP.  To decide by next visit plan.  Will contact PCP about Wegovy.  May need to start a protein shake due to not eating enough calories and not meeting protein goals.   Mary Ray is currently in the action stage of  change. As such, her goal is to continue with weight loss efforts. She has agreed to the Category 3 Plan.   Exercise goals: All adults should avoid inactivity. Some physical activity is better than none, and adults who participate in any amount of physical activity gain some health benefits.  Behavioral modification strategies: increasing lean protein intake, increasing vegetables, and increasing water intake.  Mary Ray has agreed to follow-up with our clinic in 2 weeks. She was informed of the importance of frequent follow-up visits to maximize her success with intensive lifestyle modifications for her multiple health conditions.   Objective:   Blood pressure 129/83, pulse 79, temperature 99.5 F (37.5 C), height '5\' 8"'$  (1.727 m), weight (!) 338 lb (153.3 kg), last menstrual period 09/28/2015, SpO2 98 %. Body mass index is 51.39 kg/m.  General: Cooperative, alert, well developed, in no acute distress. HEENT: Conjunctivae and lids unremarkable. Cardiovascular: Regular rhythm.  Lungs: Normal work of breathing. Neurologic: No focal deficits.   Lab Results  Component Value Date   CREATININE 0.77 03/24/2022   BUN 9 03/24/2022   NA 140 03/24/2022   K 4.2 03/24/2022   CL 102 03/24/2022   CO2 23 03/24/2022   Lab Results  Component Value Date   ALT 12 03/24/2022   AST 14 03/24/2022   ALKPHOS 57 03/24/2022   BILITOT 0.3 03/24/2022   Lab Results  Component Value Date   HGBA1C 5.7 (H) 03/24/2022   Lab Results  Component  Value Date   INSULIN 11.6 03/24/2022   Lab Results  Component Value Date   TSH 1.160 03/24/2022   Lab Results  Component Value Date   CHOL 160 03/24/2022   HDL 41 03/24/2022   LDLCALC 107 (H) 03/24/2022   TRIG 60 03/24/2022   Lab Results  Component Value Date   VD25OH 15.9 (L) 03/24/2022   Lab Results  Component Value Date   WBC 5.4 08/07/2018   HGB 12.0 08/07/2018   HCT 37.5 08/07/2018   MCV 90.1 08/07/2018   PLT 420 (H) 08/07/2018   No results  found for: "IRON", "TIBC", "FERRITIN"  Attestation Statements:   Reviewed by clinician on day of visit: allergies, medications, problem list, medical history, surgical history, family history, social history, and previous encounter notes.  I, Davy Pique, RMA, am acting as transcriptionist for Everardo Pacific, FNP  I have reviewed the above documentation for accuracy and completeness, and I agree with the above. -  ***

## 2022-05-17 ENCOUNTER — Ambulatory Visit (INDEPENDENT_AMBULATORY_CARE_PROVIDER_SITE_OTHER): Payer: BC Managed Care – PPO | Admitting: Adult Health

## 2022-06-06 ENCOUNTER — Other Ambulatory Visit (INDEPENDENT_AMBULATORY_CARE_PROVIDER_SITE_OTHER): Payer: Self-pay | Admitting: Nurse Practitioner

## 2022-06-06 DIAGNOSIS — E559 Vitamin D deficiency, unspecified: Secondary | ICD-10-CM

## 2022-06-20 ENCOUNTER — Telehealth: Payer: Self-pay | Admitting: *Deleted

## 2022-06-20 NOTE — Telephone Encounter (Signed)
Dr. Loletha Carrow,  This pt is scheduled for a screening colonoscopy.  While prepping chart, noted BMI is 51.39.  Would you like an OV or direct to hospital?  Please advise  Thanks, Cyril Mourning

## 2022-06-21 NOTE — Telephone Encounter (Signed)
Spoke with pt. Made her aware that procedure will have to be done at the hospital and that her name was placed on a wait list and it may be as long as 3 months, office will contact her with that appointment,made aware that pre-visit and procedure cancelled,verbalized understanding.

## 2022-06-21 NOTE — Telephone Encounter (Signed)
Mary Ray, Please put on waiting list for procedures in hospital-based endoscopy and inform patient that wait is currently at least 3 months.  - HD  [facility],  Please see above and add to list.  - HD

## 2022-06-21 NOTE — Telephone Encounter (Signed)
PV, will someone please notify patient of the information below Thanks  Patient has been added to hospital wait list.

## 2022-07-01 ENCOUNTER — Encounter: Payer: BC Managed Care – PPO | Admitting: Gastroenterology

## 2023-04-27 ENCOUNTER — Telehealth: Payer: Self-pay

## 2023-04-27 NOTE — Telephone Encounter (Signed)
Called and left patient a vm asking that she give me a call back to schedule her screening colonoscopy at [facility] with Dr. Myrtie Neither. I asked that patient call back to review availability.

## 2023-05-02 NOTE — Telephone Encounter (Signed)
2nd attempt to reach patient - lm on vm for patient to return call at her convenience to schedule screening colonoscopy at [facility].

## 2023-05-08 ENCOUNTER — Other Ambulatory Visit: Payer: Self-pay

## 2023-05-08 DIAGNOSIS — Z1211 Encounter for screening for malignant neoplasm of colon: Secondary | ICD-10-CM

## 2023-05-08 NOTE — Telephone Encounter (Signed)
Patient scheduled for pre visit 9/18 and confirmed procedure date for 10/3 at 11:15 arrival at 9:45.

## 2023-05-08 NOTE — Telephone Encounter (Signed)
Colonoscopy appt scheduled for 10/3 at [facility] for 11:15 am. Pt arriving at 9:45 am with care partner.

## 2023-05-17 ENCOUNTER — Ambulatory Visit (AMBULATORY_SURGERY_CENTER): Payer: BC Managed Care – PPO

## 2023-05-17 ENCOUNTER — Encounter: Payer: Self-pay | Admitting: Gastroenterology

## 2023-05-17 VITALS — Ht 68.0 in | Wt 350.0 lb

## 2023-05-17 DIAGNOSIS — Z1211 Encounter for screening for malignant neoplasm of colon: Secondary | ICD-10-CM

## 2023-05-17 MED ORDER — NA SULFATE-K SULFATE-MG SULF 17.5-3.13-1.6 GM/177ML PO SOLN: 1.0000 | Freq: Once | ORAL | 0 refills | Status: AC

## 2023-05-17 NOTE — Progress Notes (Signed)
No egg or soy allergy known to patient  No issues known to pt with past sedation with any surgeries or procedures Patient denies ever being told they had issues or difficulty with intubation  No FH of Malignant Hyperthermia Pt is not on diet pills Pt is not on  home 02  Pt is not on blood thinners  Pt denies issues with constipation  No A fib or A flutter Have any cardiac testing pending--no Pt instructed to use Singlecare.com or GoodRx for a price reduction on prep  Ambulates independently

## 2023-05-23 ENCOUNTER — Other Ambulatory Visit: Payer: Self-pay

## 2023-05-23 ENCOUNTER — Encounter (HOSPITAL_COMMUNITY): Payer: Self-pay | Admitting: Gastroenterology

## 2023-05-31 NOTE — Anesthesia Preprocedure Evaluation (Signed)
Anesthesia Evaluation  Patient identified by MRN, date of birth, ID band Patient awake    Reviewed: Allergy & Precautions, NPO status , Patient's Chart, lab work & pertinent test results  Airway Mallampati: II  TM Distance: >3 FB Neck ROM: Full    Dental no notable dental hx. (+) Teeth Intact, Dental Advisory Given   Pulmonary sleep apnea    Pulmonary exam normal breath sounds clear to auscultation       Cardiovascular hypertension, Pt. on medications Normal cardiovascular exam Rhythm:Regular Rate:Normal     Neuro/Psych negative neurological ROS  negative psych ROS   GI/Hepatic Neg liver ROS,,,  Endo/Other  negative endocrine ROS    Renal/GU negative Renal ROS     Musculoskeletal  (+) Arthritis ,    Abdominal  (+) + obese (BMI 51.7)  Peds  Hematology negative hematology ROS (+)   Anesthesia Other Findings All: Vioxx, Clidamycin, naproxen, Tramadol  Reproductive/Obstetrics                             Anesthesia Physical Anesthesia Plan  ASA: 3  Anesthesia Plan: MAC   Post-op Pain Management: Minimal or no pain anticipated   Induction:   PONV Risk Score and Plan: Treatment may vary due to age or medical condition and Propofol infusion  Airway Management Planned: Natural Airway and Nasal Cannula  Additional Equipment: None  Intra-op Plan:   Post-operative Plan:   Informed Consent: I have reviewed the patients History and Physical, chart, labs and discussed the procedure including the risks, benefits and alternatives for the proposed anesthesia with the patient or authorized representative who has indicated his/her understanding and acceptance.     Dental advisory given  Plan Discussed with: CRNA  Anesthesia Plan Comments: (Screening Colonoscopy )        Anesthesia Quick Evaluation

## 2023-06-01 ENCOUNTER — Other Ambulatory Visit: Payer: Self-pay

## 2023-06-01 ENCOUNTER — Encounter (HOSPITAL_COMMUNITY)
Admission: RE | Disposition: A | Discharge status: Home / Self Care | Payer: Self-pay | Source: Ambulatory Visit | Attending: Gastroenterology

## 2023-06-01 ENCOUNTER — Ambulatory Visit (HOSPITAL_COMMUNITY): Payer: Self-pay | Admitting: Certified Registered"

## 2023-06-01 ENCOUNTER — Encounter (HOSPITAL_COMMUNITY): Payer: Self-pay | Admitting: Gastroenterology

## 2023-06-01 ENCOUNTER — Ambulatory Visit (HOSPITAL_COMMUNITY)
Admission: RE | Admit: 2023-06-01 | Discharge: 2023-06-01 | Disposition: A | Discharge status: Home / Self Care | Payer: BC Managed Care – PPO | Source: Ambulatory Visit | Attending: Gastroenterology | Admitting: Gastroenterology

## 2023-06-01 ENCOUNTER — Ambulatory Visit (HOSPITAL_COMMUNITY): Payer: BC Managed Care – PPO | Admitting: Certified Registered"

## 2023-06-01 DIAGNOSIS — D124 Benign neoplasm of descending colon: Secondary | ICD-10-CM | POA: Diagnosis not present

## 2023-06-01 DIAGNOSIS — D120 Benign neoplasm of cecum: Secondary | ICD-10-CM | POA: Insufficient documentation

## 2023-06-01 DIAGNOSIS — Z6841 Body Mass Index (BMI) 40.0 and over, adult: Secondary | ICD-10-CM | POA: Diagnosis not present

## 2023-06-01 DIAGNOSIS — D122 Benign neoplasm of ascending colon: Secondary | ICD-10-CM | POA: Diagnosis not present

## 2023-06-01 DIAGNOSIS — E669 Obesity, unspecified: Secondary | ICD-10-CM | POA: Insufficient documentation

## 2023-06-01 DIAGNOSIS — G4730 Sleep apnea, unspecified: Secondary | ICD-10-CM | POA: Diagnosis not present

## 2023-06-01 DIAGNOSIS — K635 Polyp of colon: Secondary | ICD-10-CM

## 2023-06-01 DIAGNOSIS — Z1211 Encounter for screening for malignant neoplasm of colon: Secondary | ICD-10-CM | POA: Diagnosis not present

## 2023-06-01 DIAGNOSIS — D123 Benign neoplasm of transverse colon: Secondary | ICD-10-CM | POA: Diagnosis not present

## 2023-06-01 DIAGNOSIS — M1990 Unspecified osteoarthritis, unspecified site: Secondary | ICD-10-CM | POA: Insufficient documentation

## 2023-06-01 DIAGNOSIS — I10 Essential (primary) hypertension: Secondary | ICD-10-CM | POA: Insufficient documentation

## 2023-06-01 HISTORY — PX: POLYPECTOMY: SHX5525

## 2023-06-01 HISTORY — PX: COLONOSCOPY WITH PROPOFOL: SHX5780

## 2023-06-01 HISTORY — DX: Sleep apnea, unspecified: G47.30

## 2023-06-01 HISTORY — PX: HEMOSTASIS CLIP PLACEMENT: SHX6857

## 2023-06-01 SURGERY — COLONOSCOPY WITH PROPOFOL
Anesthesia: Monitor Anesthesia Care

## 2023-06-01 MED ORDER — SODIUM CHLORIDE 0.9 % IV SOLN
INTRAVENOUS | Status: DC
Start: 2023-06-01 — End: 2023-06-01

## 2023-06-01 MED ORDER — PROPOFOL 10 MG/ML IV BOLUS
INTRAVENOUS | Status: AC
  Filled 2023-06-01: qty 20

## 2023-06-01 MED ORDER — PROPOFOL 500 MG/50ML IV EMUL
INTRAVENOUS | Status: DC | PRN
  Administered 2023-06-01: 150 ug/kg/min via INTRAVENOUS

## 2023-06-01 MED ORDER — PROPOFOL 10 MG/ML IV BOLUS
INTRAVENOUS | Status: DC | PRN
  Administered 2023-06-01: 20 mg via INTRAVENOUS
  Administered 2023-06-01: 30 mg via INTRAVENOUS

## 2023-06-01 MED ORDER — PROPOFOL 1000 MG/100ML IV EMUL
INTRAVENOUS | Status: AC
  Filled 2023-06-01: qty 200

## 2023-06-01 MED ORDER — LACTATED RINGERS IV SOLN
INTRAVENOUS | Status: AC | PRN
Start: 2023-06-01 — End: 2023-06-01
  Administered 2023-06-01: 1000 mL via INTRAVENOUS

## 2023-06-01 MED ORDER — LIDOCAINE 2% (20 MG/ML) 5 ML SYRINGE
INTRAMUSCULAR | Status: DC | PRN
  Administered 2023-06-01: 40 mg via INTRAVENOUS

## 2023-06-01 SURGICAL SUPPLY — 22 items

## 2023-06-01 NOTE — Discharge Instructions (Addendum)

## 2023-06-01 NOTE — Transfer of Care (Signed)
Immediate Anesthesia Transfer of Care Note  Patient: Mary Ray  Procedure(s) Performed: COLONOSCOPY WITH PROPOFOL POLYPECTOMY HEMOSTASIS CLIP PLACEMENT  Patient Location: PACU and Endoscopy Unit  Anesthesia Type:MAC  Level of Consciousness: drowsy and responds to stimulation  Airway & Oxygen Therapy: Patient Spontanous Breathing and Patient connected to face mask oxygen  Post-op Assessment: Report given to RN and Post -op Vital signs reviewed and stable  Post vital signs: Reviewed and stable  Last Vitals:  Vitals Value Taken Time  BP 120/66 06/01/23 1314  Temp 36 C 06/01/23 1314  Pulse 76 06/01/23 1315  Resp 26 06/01/23 1315  SpO2 100 % 06/01/23 1315  Vitals shown include unfiled device data.  Last Pain:  Vitals:   06/01/23 1314  TempSrc: Temporal  PainSc: Asleep         Complications: No notable events documented.

## 2023-06-01 NOTE — Anesthesia Postprocedure Evaluation (Signed)
Anesthesia Post Note  Patient: Mary Ray  Procedure(s) Performed: COLONOSCOPY WITH PROPOFOL POLYPECTOMY HEMOSTASIS CLIP PLACEMENT     Patient location during evaluation: Endoscopy Anesthesia Type: MAC Level of consciousness: awake and alert Pain management: pain level controlled Vital Signs Assessment: post-procedure vital signs reviewed and stable Respiratory status: spontaneous breathing, nonlabored ventilation, respiratory function stable and patient connected to nasal cannula oxygen Cardiovascular status: blood pressure returned to baseline and stable Postop Assessment: no apparent nausea or vomiting Anesthetic complications: no   No notable events documented.  Last Vitals:  Vitals:   06/01/23 1330 06/01/23 1340  BP: (!) 142/82 (!) 161/89  Pulse: 81 83  Resp: (!) 31 18  Temp:    SpO2: 96% 100%    Last Pain:  Vitals:   06/01/23 1340  TempSrc:   PainSc: 0-No pain                 Trevor Iha

## 2023-06-01 NOTE — Interval H&P Note (Signed)
History and Physical Interval Note:  06/01/2023 10:38 AM  Mary Ray  has presented today for surgery, with the diagnosis of screening colonoscopy.  The various methods of treatment have been discussed with the patient and family. After consideration of risks, benefits and other options for treatment, the patient has consented to  Procedure(s): COLONOSCOPY WITH PROPOFOL ([name]) as a surgical intervention.  The patient's history has been reviewed, patient examined, no change in status, stable for surgery.  I have reviewed the patient's chart and labs.  Questions were answered to the patient's satisfaction.     [name]

## 2023-06-01 NOTE — Op Note (Signed)
[facility] Patient Name: Mary Ray Procedure Date: 06/01/2023 MRN: [id number] Attending MD: Starr Lake. Myrtie Neither , MD, [id number] Date of Birth: 30-Jun-1976 CSN: [id number] Age: 47 Admit Type: Inpatient Procedure:                Colonoscopy Indications:              Screening for colorectal malignant neoplasm, This                            is the patient's first colonoscopy Providers:                Sherilyn Cooter L. Myrtie Neither, MD, Fransisca Connors, Rozetta Nunnery, Technician Referring MD:             Renford Dills Medicines:                Monitored Anesthesia Care Complications:            No immediate complications. Estimated Blood Loss:     Estimated blood loss was minimal. Procedure:                Pre-Anesthesia Assessment:                           - Prior to the procedure, a History and Physical                            was performed, and patient medications and                            allergies were reviewed. The patient's tolerance of                            previous anesthesia was also reviewed. The risks                            and benefits of the procedure and the sedation                            options and risks were discussed with the patient.                            All questions were answered, and informed consent                            was obtained. Prior Anticoagulants: The patient has                            taken no anticoagulant or antiplatelet agents. ASA                            Grade Assessment: III - A patient with severe  systemic disease. After reviewing the risks and                            benefits, the patient was deemed in satisfactory                            condition to undergo the procedure.                           After obtaining informed consent, the colonoscope                            was passed under direct vision. Throughout the                             procedure, the patient's blood pressure, pulse, and                            oxygen saturations were monitored continuously. The                            CF-HQ190L ([id number]) Olympus colonoscope was                            introduced through the anus and advanced to the the                            cecum, identified by appendiceal orifice and                            ileocecal valve. The colonoscopy was performed with                            difficulty due to a redundant colon, significant                            looping, difficulty retaining insufflation at                            times, respiratory motion and the patient's body                            habitus. Successful completion of the procedure was                            aided by using manual pressure and straightening                            and shortening the scope to obtain bowel loop                            reduction. The patient tolerated the procedure  well. The quality of the bowel preparation was                            good. The ileocecal valve, appendiceal orifice, and                            rectum were photographed. Scope In: 12:27:16 PM Scope Out: 1:04:33 PM Scope Withdrawal Time: 0 hours 34 minutes 25 seconds  Total Procedure Duration: 0 hours 37 minutes 17 seconds  Findings:      The perianal and digital rectal examinations were normal.      Repeat examination of right colon under NBI performed.      Four sessile and semi-sessile polyps were found in the ascending colon       and cecum. The polyps were 6 to 10 mm in size. These polyps were removed       with a cold snare. Resection and retrieval were complete. (Jar 1)      A diminutive polyp was found in the mid transverse colon. The polyp was       semi-sessile. The polyp was removed with a cold snare. Resection and       retrieval were complete. (Jar 2)      An 18 mm polyp was found in the mid  transverse colon. The polyp was       semi-sessile. The polyp was removed with a piecemeal technique using a       cold snare. Resection and retrieval were complete. To prevent bleeding       post-intervention, one hemostatic clip was successfully placed (MR       conditional). Clip manufacturer: [employer]. (Jar 2)      A 5 mm polyp was found in the splenic flexure. The polyp was       semi-sessile. The polyp was removed with a cold snare. Resection and       retrieval were complete. (Jar 3)      A 12 mm polyp was found in the splenic flexure. The polyp was       semi-sessile. The polyp was removed with a piecemeal technique using a       hot snare. Resection and retrieval were complete. (Jar 3)      The exam was otherwise without abnormality on direct and retroflexion       views. Impression:               - Four 6 to 10 mm polyps in the ascending colon and                            in the cecum, removed with a cold snare. Resected                            and retrieved.                           - One diminutive polyp in the mid transverse colon,                            removed with a cold snare. Resected and retrieved.                           -  One 18 mm polyp in the mid transverse colon,                            removed piecemeal using a cold snare. Resected and                            retrieved. Clip (MR conditional) was placed. Clip                            manufacturer: [employer].                           - One 5 mm polyp at the splenic flexure, removed                            with a cold snare. Resected and retrieved.                           - One 12 mm polyp at the splenic flexure, removed                            piecemeal using a hot snare. Resected and retrieved.                           - The examination was otherwise normal on direct                            and retroflexion views. Moderate Sedation:      MAC sedation  used Recommendation:           - Patient has a contact number available for                            emergencies. The signs and symptoms of potential                            delayed complications were discussed with the                            patient. Return to normal activities tomorrow.                            Written discharge instructions were provided to the                            patient.                           - Resume previous diet.                           - Continue present medications.                           - Await pathology results.                           -  Repeat colonoscopy is recommended for                            surveillance. The colonoscopy date will be                            determined after pathology results from today's                            exam become available for review. (Most likely 6 to                            12 months if most overall polyps are SSP's) Procedure Code(s):        --- Professional ---                           [phone], Colonoscopy, flexible; with removal of                            tumor(s), polyp(s), or other lesion(s) by snare                            technique Diagnosis Code(s):        --- Professional ---                           Z12.11, Encounter for screening for malignant                            neoplasm of colon                           D12.2, Benign neoplasm of ascending colon                           D12.0, Benign neoplasm of cecum                           D12.3, Benign neoplasm of transverse colon (hepatic                            flexure or splenic flexure) CPT copyright 2022 American Medical Association. All rights reserved. The codes documented in this report are preliminary and upon coder review may  be revised to meet current compliance requirements. Selenia Mihok L. Myrtie Neither, MD 06/01/2023 1:13:50 PM This report has been signed electronically. Number of Addenda: 0

## 2023-06-01 NOTE — Anesthesia Procedure Notes (Signed)
Procedure Name: MAC Date/Time: 06/01/2023 12:24 PM  Performed by: Sindy Guadeloupe, CRNAPre-anesthesia Checklist: Patient identified, Emergency Drugs available, Suction available, Patient being monitored and Timeout performed Oxygen Delivery Method: Simple face mask Placement Confirmation: positive ETCO2

## 2023-06-01 NOTE — H&P (Signed)
History and Physical:  This patient presents for endoscopic testing for: Colon cancer screening  This is a 47 year old woman here today for her for screening colonoscopy. Patient denies chronic abdominal pain, rectal bleeding, constipation or diarrhea. No family history of colorectal cancer Procedures being done in the hospital outpatient endoscopy lab due to BMI over 50  Patient is otherwise without complaints or active issues today.   Past Medical History: Past Medical History:  Diagnosis Date   Arthritis    Back   Back pain    High blood pressure    Joint pain    Pre-diabetes    Sleep apnea    No longer uses CPAP     Past Surgical History: Past Surgical History:  Procedure Laterality Date   ABDOMINAL HYSTERECTOMY [name] 10/19/2015   Procedure:  HYSTERECTOMY ABDOMINAL WITH RIGHT SALPINGECTOMYand left oophorectomy;  Surgeon: Richarda Overlie, MD;  Location: [facility];  Service: Gynecology;  Laterality: [name];   BREAST REDUCTION SURGERY     CESAREAN SECTION  08/16/2011   Procedure: CESAREAN SECTION;  Surgeon: Juluis Mire;  Location: [facility];  Service: Gynecology;  Laterality: [name];   CHOLECYSTECTOMY [name] 09/29/2017   Procedure: LAPAROSCOPIC CHOLECYSTECTOMY WITH INTRAOPERATIVE CHOLANGIOGRAM;  Surgeon: Darnell Level, MD;  Location: [facility];  Service: General;  Laterality: [name];   DIAGNOSTIC LAPAROSCOPY     KNEE ARTHROSCOPY WITH MEDIAL MENISECTOMY Right 08/14/2018   Procedure: RIGHT KNEE ARTHROSCOPY WITH MEDIAL MENISECTOMY AND CHONDROPLASTY;  Surgeon: Sheral Apley, MD;  Location: [facility];  Service: Orthopedics;  Laterality: Right;   MOUTH SURGERY     SALPINGECTOMY  2004   Scar tissue???   WISDOM TOOTH EXTRACTION      Allergies: Allergies  Allergen Reactions   Vioxx [Rofecoxib] Hives and Rash    Patient reported it was years ago.  Pt. Says she can take ibuprofen   Clindamycin/Lincomycin Swelling    Facial swelling; allergic to anything ending in 'mycin'   Naproxen    Tramadol      Outpatient Meds: Current Facility-Administered Medications  Medication Dose Route Frequency Provider Last Rate Last Admin   0.9 %  sodium chloride infusion   Intravenous Continuous Danis, Starr Lake III, MD       lactated ringers infusion    Continuous PRN [name], MD 10 mL/hr at 06/01/23 1005 1,000 mL at 06/01/23 1005      ___________________________________________________________________ Objective   Exam:  BP (!) 176/73   Pulse 66   Temp (!) 97.5 F (36.4 C) (Temporal)   Resp 13   Ht 5\' 8"  (1.727 m)   Wt (!) 161.5 kg   LMP 09/28/2015 (Exact Date)   SpO2 98%   BMI 54.13 kg/m   CV: regular , S1/S2 Resp: clear to auscultation bilaterally, normal RR and effort noted GI: soft, no tenderness, with active bowel sounds.   Assessment: Average risk for colorectal cancer screening   Plan: Colonoscopy   The benefits and risks of the planned procedure were described in detail with the patient or (when appropriate) their health care proxy.  Risks were outlined as including, but not limited to, bleeding, infection, perforation, adverse medication reaction leading to cardiac or pulmonary decompensation, pancreatitis (if ERCP).  The limitation of incomplete mucosal visualization was also discussed.  No guarantees or warranties were given. Patient at increased risk for cardiopulmonary complications of procedure due to medical comorbidities.  The patient is appropriate for an endoscopic procedure in the ambulatory setting.   - Amada Jupiter, MD

## 2023-06-05 ENCOUNTER — Encounter (HOSPITAL_COMMUNITY): Payer: Self-pay | Admitting: Gastroenterology

## 2023-06-05 ENCOUNTER — Encounter: Payer: Self-pay | Admitting: Gastroenterology

## 2023-06-05 LAB — SURGICAL PATHOLOGY

## 2023-09-07 ENCOUNTER — Encounter: Payer: Self-pay | Admitting: Allergy & Immunology

## 2023-09-07 ENCOUNTER — Ambulatory Visit: Payer: 59 | Admitting: Allergy & Immunology

## 2023-09-07 ENCOUNTER — Other Ambulatory Visit: Payer: Self-pay

## 2023-09-07 VITALS — BP 138/82 | HR 94 | Temp 98.0°F | Resp 28 | Ht 67.5 in | Wt 369.1 lb

## 2023-09-07 DIAGNOSIS — R21 Rash and other nonspecific skin eruption: Secondary | ICD-10-CM | POA: Diagnosis not present

## 2023-09-07 NOTE — Patient Instructions (Addendum)
 1. Facial rash - I did not do testing since I did not think that this was going to be useful. - Sample of Opzelura  provided (this is a non-steroidal and is safe to use on the face). - We will send in the prescription to a mail order pharmacy. - Call us  with updates.   2. Return if symptoms worsen or fail to improve. You can have the follow up appointment with Dr. Iva or a Nurse Practicioner (our Nurse Practitioners are excellent and always have Physician oversight!).    Please inform us  of any Emergency Department visits, hospitalizations, or changes in symptoms. Call us  before going to the ED for breathing or allergy symptoms since we might be able to fit you in for a sick visit. Feel free to contact us  anytime with any questions, problems, or concerns.  It was a pleasure to meet you today! Thank you for your service with the schools!!   Websites that have reliable patient information: 1. American Academy of Asthma, Allergy, and Immunology: www.aaaai.org 2. Food Allergy Research and Education (FARE): foodallergy.org 3. Mothers of Asthmatics: http://www.asthmacommunitynetwork.org 4. American College of Allergy, Asthma, and Immunology: www.acaai.org      "Like" us  on Facebook and Instagram for our latest updates!      A healthy democracy works best when [employer] participate! Make sure you are registered to vote! If you have moved or changed any of your contact information, you will need to get this updated before voting! Scan the QR codes below to learn more!

## 2023-09-07 NOTE — Progress Notes (Signed)
 NEW PATIENT  Date of Service/Encounter:  09/07/23  Consult requested by: Rexanne Ingle, MD   Assessment:   Facial rash - started November 2024   No atopic history - therefore did not pursue allergy testing  Multiple colon polyps - sees Dr. Legrand  Plan/Recommendations:   1. Facial rash - I did not do testing since I did not think that this was going to be useful. - Sample of Opzelura  provided (this is a non-steroidal and is safe to use on the face). - We will send in the prescription to a mail order pharmacy. - Call us  with updates.   2. Return if symptoms worsen or fail to improve. You can have the follow up appointment with Dr. Iva or a Nurse Practicioner (our Nurse Practitioners are excellent and always have Physician oversight!).    This note in its entirety was forwarded to the Provider who requested this consultation.  Subjective:   Mary Ray is a 48 y.o. female presenting today for evaluation of  Chief Complaint  Patient presents with   Urticaria    States that bumps appeared on face x 1 month ago.   Rash   Pruritus   Nasal Congestion   Cough    Mary Ray has a history of the following: Patient Active Problem List   Diagnosis Date Noted   Benign neoplasm of cecum 06/01/2023   Benign neoplasm of ascending colon 06/01/2023   Benign neoplasm of transverse colon 06/01/2023   Prediabetes 03/28/2022   Other fatigue 03/24/2022   SOB (shortness of breath) on exertion 03/24/2022   Elevated glucose 03/24/2022   Vitamin D  deficiency 03/24/2022   Special screening for malignant neoplasms, colon 03/24/2022   Essential hypertension 03/24/2022   Class 3 severe obesity with serious comorbidity and body mass index (BMI) of 50.0 to 59.9 in adult [facility]) 03/24/2022   Cholelithiasis with chronic cholecystitis 09/26/2017   Menorrhagia 10/19/2015   Threatened preterm labor, antepartum 06/18/2011    History obtained from: chart review and  patient.  Discussed the use of AI scribe software for clinical note transcription with the patient and/or guardian, who gave verbal consent to proceed.  Mary Ray was referred by Rexanne Ingle, MD.     Mary Ray is a 48 y.o. female presenting for an evaluation of a facial rash .  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 The rash, initially itchy, is now primarily present without significant discomfort. The rash is localized to the right side of the face. The patient denies any new cosmetic use or significant changes in routine prior to the onset of the rash. She also denies any worsening of the rash or association with environmental factors.  In addition, the patient underwent a colonoscopy due to age guidelines, during which multiple polyps were identified and removed. The patient does not recall the exact timing of the procedure but believes it was approximately two years ago. She denies any complications or ongoing issues related to the colonoscopy. She is supposed to make a follow up appointment in the near future.   The patient also mentions occasional environmental allergies but does not elaborate on specific triggers or symptoms. She denies any history of asthma or sleep apnea.    Otherwise, there is no history of other atopic diseases, including drug allergies, stinging insect allergies, or contact dermatitis. There is no significant infectious history. Vaccinations are up to date.  Past Medical History: Patient Active Problem List   Diagnosis Date Noted   Benign neoplasm of cecum 06/01/2023   Benign neoplasm of ascending colon 06/01/2023   Benign neoplasm of transverse colon 06/01/2023   Prediabetes 03/28/2022   Other fatigue 03/24/2022   SOB (shortness of breath) on exertion 03/24/2022   Elevated glucose 03/24/2022   Vitamin D  deficiency 03/24/2022   Special screening for malignant neoplasms,  colon 03/24/2022   Essential hypertension 03/24/2022   Class 3 severe obesity with serious comorbidity and body mass index (BMI) of 50.0 to 59.9 in adult [facility]) 03/24/2022   Cholelithiasis with chronic cholecystitis 09/26/2017   Menorrhagia 10/19/2015   Threatened preterm labor, antepartum 06/18/2011    Medication List:  Allergies as of 09/07/2023       Reactions   Vioxx [rofecoxib] Hives, Rash   Patient reported it was years ago.  Pt. Says she can take ibuprofen    Clindamycin/lincomycin Swelling   Facial swelling; allergic to anything ending in 'mycin'   Naproxen     Tramadol          Medication List        Accurate as of September 07, 2023 11:59 PM. If you have any questions, ask your nurse or doctor.          benzonatate  100 MG capsule Commonly known as: TESSALON  Take 1 capsule (100 mg total) by mouth every 8 (eight) hours.   Dextromethorphan  HBr 15 MG Tabs Take 15 mg by mouth 4 (four) times daily as needed.   Diclofenac Epolamine 1.3 % Pt24 1 patch as needed   EPINEPHrine  0.3 mg/0.3 mL Soaj injection Commonly known as: EpiPen  Inject 0.3 mLs (0.3 mg total) into the muscle once.   fluticasone  50 MCG/ACT nasal spray Commonly known as: FLONASE  Place 2 sprays into both nostrils daily.   guaiFENesin  600 MG 12 hr tablet Commonly known as: Mucinex  Take 2 tablets (1,200 mg total) by mouth 2 (two) times daily.   HYDROcodone -acetaminophen  10-325 MG tablet Commonly known as: NORCO SMARTSIG:1 Tablet(s) By Mouth 3-6 Times Daily   ibuprofen  400 MG tablet Commonly known as: ADVIL  SMARTSIG:1 Tablet(s) By Mouth 2-4 Times Daily   losartan-hydrochlorothiazide 50-12.5 MG tablet Commonly known as: HYZAAR 1 tablet   naloxone  4 MG/0.1ML Liqd nasal spray kit Commonly known as: NARCAN  SMARTSIG:Both Nares   ondansetron  4 MG tablet Commonly known as: Zofran  Take 1 tablet (4 mg total) by mouth every 8 (eight) hours as needed for nausea or vomiting.   Vitamin D   (Ergocalciferol ) 1.25 MG (50000 UNIT) Caps capsule Commonly known as: DRISDOL  Take 1 capsule (50,000 Units total) by mouth every 7 (seven) days.        Birth History: non-contributory  Developmental History: non-contributory  Past Surgical History: Past Surgical History:  Procedure Laterality Date   ABDOMINAL HYSTERECTOMY [name] 10/19/2015   Procedure:  HYSTERECTOMY ABDOMINAL WITH RIGHT SALPINGECTOMYand left oophorectomy;  Surgeon: Charlie Croak, MD;  Location: [facility];  Service: Gynecology;  Laterality: [name];   BREAST REDUCTION SURGERY     CESAREAN SECTION  08/16/2011   Procedure: CESAREAN SECTION;  Surgeon: Norleen GORMAN Skill;  Location: [facility];  Service: Gynecology;  Laterality: [name];   CHOLECYSTECTOMY [name] 09/29/2017   Procedure: LAPAROSCOPIC CHOLECYSTECTOMY WITH INTRAOPERATIVE CHOLANGIOGRAM;  Surgeon: Eletha Boas, MD;  Location: [facility];  Service: General;  Laterality: [name];   COLONOSCOPY WITH PROPOFOL  [name] 06/01/2023   Procedure: COLONOSCOPY WITH PROPOFOL ;  Surgeon: Legrand Victory LITTIE DOUGLAS, MD;  [facility];  Service: Gastroenterology;  Laterality: [name];  DIAGNOSTIC LAPAROSCOPY     HEMOSTASIS CLIP PLACEMENT  06/01/2023   Procedure: HEMOSTASIS CLIP PLACEMENT;  Surgeon: Legrand Victory LITTIE DOUGLAS, MD;  [facility];  Service: Gastroenterology;;   KNEE ARTHROSCOPY WITH MEDIAL MENISECTOMY Right 08/14/2018   Procedure: RIGHT KNEE ARTHROSCOPY WITH MEDIAL MENISECTOMY AND CHONDROPLASTY;  Surgeon: Beverley Evalene BIRCH, MD;  Location: [facility];  Service: Orthopedics;  Laterality: Right;   MOUTH SURGERY     POLYPECTOMY  06/01/2023   Procedure: POLYPECTOMY;  Surgeon: Legrand Victory LITTIE DOUGLAS, MD;  [facility];  Service: Gastroenterology;;   SALPINGECTOMY  2004   Scar tissue???   WISDOM TOOTH EXTRACTION       Family History: Family History  Problem Relation Age of Onset   Diabetes Mother    Hypertension Mother    Sleep apnea Mother    Diabetes Father    Stroke Father    Hypertension  Father    Hypertension Sister    Learning disabilities Cousin        autism   Anesthesia problems Neg Hx    Hypotension Neg Hx    Malignant hyperthermia Neg Hx    Pseudochol deficiency Neg Hx    Breast cancer Neg Hx    Colon cancer Neg Hx    Colon polyps Neg Hx    Esophageal cancer Neg Hx    Rectal cancer Neg Hx    Stomach cancer Neg Hx      Social History: Mary Ray lives at home with her family.  She lives in an apartment.  There is carpeting throughout the apartment.  They have gas heating and central cooling.  There are no animals inside or outside of the home.  There are no dust mite covers on the bedding.  There is no tobacco exposure.  She is exposed to dust in her work environment.  There is no HEPA filter.  [place] do not live [place] in interstate or industrial area.   Review of systems otherwise negative other than that mentioned in the HPI.    Objective:   Blood pressure 138/82, pulse 94, temperature 98 F (36.7 C), resp. rate (!) 28, height 5' 7.5 (1.715 m), weight (!) 369 lb 1.6 oz (167.4 kg), last menstrual period 09/28/2015, SpO2 97%. Body mass index is 56.96 kg/m.     Physical Exam Vitals reviewed.  Constitutional:      Appearance: She is well-developed. She is obese.  HENT:     Head: Normocephalic and atraumatic.     Right Ear: Tympanic membrane, ear canal and external ear normal. No drainage, swelling or tenderness. Tympanic membrane is not injected, scarred, erythematous, retracted or bulging.     Left Ear: Tympanic membrane, ear canal and external ear normal. No drainage, swelling or tenderness. Tympanic membrane is not injected, scarred, erythematous, retracted or bulging.     Nose: No nasal deformity, septal deviation, mucosal edema or rhinorrhea.     Right Sinus: No maxillary sinus tenderness or frontal sinus tenderness.     Left Sinus: No maxillary sinus tenderness or frontal sinus tenderness.     Mouth/Throat:     Mouth: Mucous membranes are not pale  and not dry.     Pharynx: Uvula midline.  Eyes:     General:        Right eye: No discharge.        Left eye: No discharge.     Conjunctiva/sclera: Conjunctivae normal.     Right eye: Right conjunctiva is not injected. No chemosis.  Left eye: Left conjunctiva is not injected. No chemosis.    Pupils: Pupils are equal, round, and reactive to light.  Cardiovascular:     Rate and Rhythm: Normal rate and regular rhythm.     Heart sounds: Normal heart sounds.  Pulmonary:     Effort: Pulmonary effort is normal. No tachypnea, accessory muscle usage or respiratory distress.     Breath sounds: Normal breath sounds. No wheezing, rhonchi or rales.  Chest:     Chest wall: No tenderness.  Abdominal:     Tenderness: There is no abdominal tenderness. There is no guarding or rebound.  Lymphadenopathy:     Head:     Right side of head: No submandibular, tonsillar or occipital adenopathy.     Left side of head: No submandibular, tonsillar or occipital adenopathy.     Cervical: No cervical adenopathy.  Skin:    General: Skin is warm.     Capillary Refill: Capillary refill takes less than 2 seconds.     Coloration: Skin is not pale.     Findings: Rash present. No abrasion, erythema or petechiae. Rash is not papular, urticarial or vesicular.     Comments: There is a roughened patch on her right cheek.  It extends from the side of her lip to her infraorbital area.  There is some excoriation.  There is minimal erythema.  There is no honey crusting or oozing noted.  Neurological:     Mental Status: She is alert.  Psychiatric:        Behavior: Behavior is cooperative.      Diagnostic studies: none         Marty Shaggy, MD [facility] 

## 2023-09-08 ENCOUNTER — Encounter: Payer: Self-pay | Admitting: Allergy & Immunology

## 2023-09-08 MED ORDER — OPZELURA 1.5 % EX CREA: 1.0000 | TOPICAL_CREAM | Freq: Two times a day (BID) | CUTANEOUS | 5 refills | Status: DC | PRN

## 2023-09-11 ENCOUNTER — Other Ambulatory Visit (HOSPITAL_COMMUNITY): Payer: Self-pay

## 2023-09-11 ENCOUNTER — Telehealth: Payer: Self-pay

## 2023-09-11 NOTE — Telephone Encounter (Signed)
 Pharmacy Patient Advocate Encounter  Received notification from [facility] that Prior Authorization for Opzelura  1.5% cream  has been APPROVED from 09/11/2023 to 12/10/2023. Ran test claim, Copay is $0.00. This test claim was processed through [facility]- copay amounts may vary at other pharmacies due to pharmacy/plan contracts, or as the patient moves through the different stages of their insurance plan.   PA #/Case ID/Reference #: [id number]

## 2023-09-20 MED ORDER — OPZELURA 1.5 % EX CREA: 1.0000 | TOPICAL_CREAM | Freq: Two times a day (BID) | CUTANEOUS | 5 refills | Status: AC | PRN

## 2023-09-20 NOTE — Telephone Encounter (Addendum)
Resent prescription with approval information to [facility].

## 2023-09-20 NOTE — Addendum Note (Signed)
Addended by: Areta Haber B on: 09/20/2023 05:32 PM   Modules accepted: Orders

## 2023-11-13 ENCOUNTER — Telehealth: Payer: Self-pay

## 2023-11-13 NOTE — Telephone Encounter (Signed)
*  Asthma/Allergy  Pharmacy Patient Advocate Encounter   Received notification from CoverMyMeds that prior authorization for Opzelura 1.5% cream  is required/requested.   Insurance verification completed.   The patient is insured through [facility] .   Per test claim: PA required; PA submitted to above mentioned insurance via CoverMyMeds Key/confirmation #/EOC [id number] Status is pending

## 2023-11-14 ENCOUNTER — Other Ambulatory Visit (HOSPITAL_COMMUNITY): Payer: Self-pay

## 2023-11-14 NOTE — Telephone Encounter (Signed)
 Pharmacy Patient Advocate Encounter  Received notification from [facility] that Prior Authorization for Opzelura has been APPROVED from 11/14/2023 to 11/13/2024. Unable to obtain price due to refill too soon rejection, last fill date 10/27/2023 next available fill date03/23/2025   *renewal request

## 2023-12-29 ENCOUNTER — Other Ambulatory Visit (HOSPITAL_COMMUNITY): Payer: Self-pay | Admitting: Internal Medicine

## 2023-12-29 ENCOUNTER — Ambulatory Visit (HOSPITAL_COMMUNITY)
Admission: RE | Admit: 2023-12-29 | Discharge: 2023-12-29 | Disposition: A | Discharge status: Home / Self Care | Source: Ambulatory Visit | Attending: Vascular Surgery | Admitting: Vascular Surgery

## 2023-12-29 DIAGNOSIS — M7989 Other specified soft tissue disorders: Secondary | ICD-10-CM | POA: Insufficient documentation

## 2023-12-29 DIAGNOSIS — M79662 Pain in left lower leg: Secondary | ICD-10-CM | POA: Diagnosis present

## 2024-07-09 ENCOUNTER — Ambulatory Visit

## 2024-07-09 ENCOUNTER — Ambulatory Visit: Admitting: Podiatry

## 2024-07-09 VITALS — Ht 67.5 in | Wt 369.0 lb

## 2024-07-09 DIAGNOSIS — M216X1 Other acquired deformities of right foot: Secondary | ICD-10-CM

## 2024-07-09 DIAGNOSIS — M722 Plantar fascial fibromatosis: Secondary | ICD-10-CM

## 2024-07-09 DIAGNOSIS — M216X2 Other acquired deformities of left foot: Secondary | ICD-10-CM | POA: Diagnosis not present

## 2024-07-09 DIAGNOSIS — M79671 Pain in right foot: Secondary | ICD-10-CM | POA: Diagnosis not present

## 2024-07-09 DIAGNOSIS — M62461 Contracture of muscle, right lower leg: Secondary | ICD-10-CM

## 2024-07-09 MED ORDER — MELOXICAM 15 MG PO TABS: 15.0000 mg | ORAL_TABLET | Freq: Every day | ORAL | 3 refills | Status: AC

## 2024-07-09 NOTE — Patient Instructions (Signed)
 VISIT SUMMARY: You came in today due to pain and tenderness in your right foot, which has been bothering you for almost a month. The pain worsens when you put pressure on it, especially towards the back of your foot. You have no history of injuries to the area, and your job requires you to be on your feet constantly, which makes the pain worse.  YOUR PLAN: -RIGHT PLANTAR FASCIITIS WITH PES PLANUS AND EQUINUS CONTRACTURE: Plantar fasciitis is inflammation of the tissue along the bottom of your foot, often caused by a tight Achilles tendon and flat arches. Your examination showed tenderness in the plantar fascia, tight calf muscles, and a flat arch. X-rays confirmed a severe flat foot deformity and a small heel spur. We have started you on home physical therapy exercises to stretch your calf muscles, prescribed meloxicam for four weeks to reduce inflammation, and gave you a corticosteroid injection today. You will also use a night splint to help with the tightness in your calf and get fitted for custom orthotics to support your arch. We will see you again in six weeks to check on your progress.  INSTRUCTIONS: Please follow the home physical therapy exercises as instructed and take meloxicam as prescribed. Use the night splint as recommended. Attend your fitting for custom orthotics and follow up with the pedorthist. We will see you again in six weeks for a follow-up visit to re-evaluate your condition.                      Contains text generated by [employer].                      Plantar Fasciitis (Heel Spur Syndrome) with Rehab The plantar fascia is a fibrous, ligament-like, soft-tissue structure that spans the bottom of the foot. Plantar fasciitis is a condition that causes pain in the foot due to inflammation of the tissue. SYMPTOMS  Pain and tenderness on the underneath side of the foot. Pain that worsens with standing or walking. CAUSES  Plantar fasciitis  is caused by irritation and injury to the plantar fascia on the underneath side of the foot. Common mechanisms of injury include: Direct trauma to bottom of the foot. Damage to a small nerve that runs under the foot where the main fascia attaches to the heel bone. Stress placed on the plantar fascia due to bone spurs. RISK INCREASES WITH:  Activities that place stress on the plantar fascia (running, jumping, pivoting, or cutting). Poor strength and flexibility. Improperly fitted shoes. Tight calf muscles. Flat feet. Failure to warm-up properly before activity. Obesity. PREVENTION Warm up and stretch properly before activity. Allow for adequate recovery between workouts. Maintain physical fitness: Strength, flexibility, and endurance. Cardiovascular fitness. Maintain a health body weight. Avoid stress on the plantar fascia. Wear properly fitted shoes, including arch supports for individuals who have flat feet.  PROGNOSIS  If treated properly, then the symptoms of plantar fasciitis usually resolve without surgery. However, occasionally surgery is necessary.  RELATED COMPLICATIONS  Recurrent symptoms that may result in a chronic condition. Problems of the lower back that are caused by compensating for the injury, such as limping. Pain or weakness of the foot during push-off following surgery. Chronic inflammation, scarring, and partial or complete fascia tear, occurring more often from repeated injections.  TREATMENT  Treatment initially involves the use of ice and medication to help reduce pain and inflammation. The use of strengthening and stretching exercises may help reduce pain  with activity, especially stretches of the Achilles tendon. These exercises may be performed at home or with a therapist. Your caregiver may recommend that you use heel cups of arch supports to help reduce stress on the plantar fascia. Occasionally, corticosteroid injections are given to reduce inflammation.  If symptoms persist for greater than 6 months despite non-surgical (conservative), then surgery may be recommended.   MEDICATION  If pain medication is necessary, then nonsteroidal anti-inflammatory medications, such as aspirin  and ibuprofen , or other minor pain relievers, such as acetaminophen , are often recommended. Do not take pain medication within 7 days before surgery. Prescription pain relievers may be given if deemed necessary by your caregiver. Use only as directed and only as much as you need. Corticosteroid injections may be given by your caregiver. These injections should be reserved for the most serious cases, because they may only be given a certain number of times.  HEAT AND COLD Cold treatment (icing) relieves pain and reduces inflammation. Cold treatment should be applied for 10 to 15 minutes every 2 to 3 hours for inflammation and pain and immediately after any activity that aggravates your symptoms. Use ice packs or massage the area with a piece of ice (ice massage). Heat treatment may be used prior to performing the stretching and strengthening activities prescribed by your caregiver, physical therapist, or athletic trainer. Use a heat pack or soak the injury in warm water .  SEEK IMMEDIATE MEDICAL CARE IF: Treatment seems to offer no benefit, or the condition worsens. Any medications produce adverse side effects.  EXERCISES- RANGE OF MOTION (ROM) AND STRETCHING EXERCISES - Plantar Fasciitis (Heel Spur Syndrome) These exercises may help you when beginning to rehabilitate your injury. Your symptoms may resolve with or without further involvement from your physician, physical therapist or athletic trainer. While completing these exercises, remember:  Restoring tissue flexibility helps normal motion to return to the joints. This allows healthier, less painful movement and activity. An effective stretch should be held for at least 30 seconds. A stretch should never be painful. You  should only feel a gentle lengthening or release in the stretched tissue.  RANGE OF MOTION - Toe Extension, Flexion Sit with your right / left leg crossed over your opposite knee. Grasp your toes and gently pull them back toward the top of your foot. You should feel a stretch on the bottom of your toes and/or foot. Hold this stretch for 10 seconds. Now, gently pull your toes toward the bottom of your foot. You should feel a stretch on the top of your toes and or foot. Hold this stretch for 10 seconds. Repeat  times. Complete this stretch 3 times per day.   RANGE OF MOTION - Ankle Dorsiflexion, Active Assisted Remove shoes and sit on a chair that is preferably not on a carpeted surface. Place right / left foot under knee. Extend your opposite leg for support. Keeping your heel down, slide your right / left foot back toward the chair until you feel a stretch at your ankle or calf. If you do not feel a stretch, slide your bottom forward to the edge of the chair, while still keeping your heel down. Hold this stretch for 10 seconds. Repeat 3 times. Complete this stretch 2 times per day.   STRETCH  Gastroc, Standing Place hands on wall. Extend right / left leg, keeping the front knee somewhat bent. Slightly point your toes inward on your back foot. Keeping your right / left heel on the floor and your knee  straight, shift your weight toward the wall, not allowing your back to arch. You should feel a gentle stretch in the right / left calf. Hold this position for 10 seconds. Repeat 3 times. Complete this stretch 2 times per day.  STRETCH  Soleus, Standing Place hands on wall. Extend right / left leg, keeping the other knee somewhat bent. Slightly point your toes inward on your back foot. Keep your right / left heel on the floor, bend your back knee, and slightly shift your weight over the back leg so that you feel a gentle stretch deep in your back calf. Hold this position for 10  seconds. Repeat 3 times. Complete this stretch 2 times per day.  STRETCH  Gastrocsoleus, Standing  Note: This exercise can place a lot of stress on your foot and ankle. Please complete this exercise only if specifically instructed by your caregiver.  Place the ball of your right / left foot on a step, keeping your other foot firmly on the same step. Hold on to the wall or a rail for balance. Slowly lift your other foot, allowing your body weight to press your heel down over the edge of the step. You should feel a stretch in your right / left calf. Hold this position for 10 seconds. Repeat this exercise with a slight bend in your right / left knee. Repeat 3 times. Complete this stretch 2 times per day.   STRENGTHENING EXERCISES - Plantar Fasciitis (Heel Spur Syndrome)  These exercises may help you when beginning to rehabilitate your injury. They may resolve your symptoms with or without further involvement from your physician, physical therapist or athletic trainer. While completing these exercises, remember:  Muscles can gain both the endurance and the strength needed for everyday activities through controlled exercises. Complete these exercises as instructed by your physician, physical therapist or athletic trainer. Progress the resistance and repetitions only as guided.  STRENGTH - Towel Curls Sit in a chair positioned on a non-carpeted surface. Place your foot on a towel, keeping your heel on the floor. Pull the towel toward your heel by only curling your toes. Keep your heel on the floor. Repeat 3 times. Complete this exercise 2 times per day.  STRENGTH - Ankle Inversion Secure one end of a rubber exercise band/tubing to a fixed object (table, pole). Loop the other end around your foot just before your toes. Place your fists between your knees. This will focus your strengthening at your ankle. Slowly, pull your big toe up and in, making sure the band/tubing is positioned to resist the  entire motion. Hold this position for 10 seconds. Have your muscles resist the band/tubing as it slowly pulls your foot back to the starting position. Repeat 3 times. Complete this exercises 2 times per day.  Document Released: 08/15/2005 Document Revised: 11/07/2011 Document Reviewed: 11/27/2008 [facility] Patient Information 2014 [place], [place].

## 2024-07-12 NOTE — Progress Notes (Signed)
 Subjective:  Patient ID: Mary Ray, female    DOB: 09-24-1975,  MRN: [id number]  Chief Complaint  Patient presents with   Heel Spurs    RM 3 Patient is here for right foot heel pain. Patient si requesting an injection. Pt states pain has been present for 3 weeks. Pt states no injury to right foot.   Foot Pain    Discussed the use of AI scribe software for clinical note transcription with the patient, who gave verbal consent to proceed.  History of Present Illness Mary Ray is a 48 year old female who presents with right foot pain and tenderness.  She has been experiencing tenderness in her right foot for almost a month, with pain worsening when pressure is applied, particularly towards the back of the foot. No prior injuries to the area are reported. She is not taking any medications for the foot pain. She has allergies to Vioxx and clindamycin but has not had any adverse reactions to lidocaine  during previous knee injections.  Her job requires her to be on her feet constantly, which exacerbates the pain as she tries to avoid putting pressure on the affected foot. She has not experienced similar symptoms before and has no history of plantar fasciitis.  She has been receiving injections in her knees for arthritis, which she describes as 'really bad'.  She recalls her sister experiencing numbness in her foot when she was younger, but no similar symptoms have occurred for her.  No gastric reflux or stomach issues.      Objective:    Physical Exam VASCULAR: DP and PT pulse palpable. Foot is warm and well-perfused. Capillary fill time is brisk. DERMATOLOGIC: Normal skin turgor, texture, and temperature. No open lesions, rashes, or ulcerations. NEUROLOGIC: Normal sensation to light touch and pressure. No paresthesias on examination. ORTHOPEDIC: Tenderness on palpation of the foot. Tightness in the calf muscle. Pes planus (flat foot) observed. Tight Achilles tendon with  straight heel bone. Minimal heel spur observed. No ecchymosis or bruising. No gross deformity.   No images are attached to the encounter.    Results RADIOLOGY Right foot radiograph: Severe pes planus deformity, minimal calcaneal spur, mild dorsal talonavicular spur, no fracture or stress fracture (07/09/2024)   Assessment:   1. Plantar fasciitis, right   2. Gastrocnemius equinus of right lower extremity   3. Pronation of left foot   4. Pronation of right foot      Plan:  Patient was evaluated and treated and all questions answered.  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 Symptoms have persisted for almost a month, exacerbated by prolonged standing due to her job. Examination reveals tenderness in the plantar fascia, tightness in the calf muscle, and a flat arch. Radiographs show severe pes planus deformity, a very small heel spur, and mild dorsal talonavicular spur. No fracture or stress fracture noted. The condition is likely due to the tight Achilles tendon causing flattening of the arch and stretching of the plantar fascia. No significant heel spur requiring removal. Discussed treatment options including physical therapy, medication, and orthotics. Explained that heel spurs do not need removal to alleviate symptoms. - Initiated home physical therapy and exercises to address calf muscle tightness. - Prescribed meloxicam for four weeks to control inflammation. - Administered corticosteroid injection today to reduce inflammation. - Recommended night splint to address equinus contracture. - Scheduled fitting for  custom molded foot orthoses to support the medial longitudinal arch and offload the plantar fascia. - Scheduled follow-up visit in six weeks to re-evaluate condition. - Referred to pedorthist for orthotics fitting.    After  sterile prep with povidone-iodine solution and alcohol, the right heel was injected with 0.5cc 2% xylocaine  plain, 0.5cc 0.5% marcaine  plain, 20 mg triamcinolone acetonide, and 4 mg dexamethasone  was injected along the medial plantar fascia at the insertion on the plantar calcaneus. The patient tolerated the procedure well without complication.   Return in about 6 weeks (around 08/20/2024) for recheck plantar fasciitis.

## 2024-07-23 NOTE — H&P (Signed)
 PREOPERATIVE H&P  Chief Complaint: LEFT KNEE MEDIAL MENISCUS TEAR  HPI: Mary Ray is a 48 y.o. female who presents with a diagnosis of LEFT KNEE MEDIAL MENISCUS TEAR. Symptoms are rated as moderate to severe, and have been worsening.  This is significantly impairing activities of daily living.  She has elected for surgical management.   Past Medical History:  Diagnosis Date   Arthritis    Back   Back pain    High blood pressure    Joint pain    Pre-diabetes    Sleep apnea    No longer uses CPAP   Past Surgical History:  Procedure Laterality Date   ABDOMINAL HYSTERECTOMY [name] 10/19/2015   Procedure:  HYSTERECTOMY ABDOMINAL WITH RIGHT SALPINGECTOMYand left oophorectomy;  Surgeon: Charlie Croak, MD;  Location: [facility];  Service: Gynecology;  Laterality: [name];   BREAST REDUCTION SURGERY     CESAREAN SECTION  08/16/2011   Procedure: CESAREAN SECTION;  Surgeon: Norleen GORMAN Skill;  Location: [facility];  Service: Gynecology;  Laterality: [name];   CHOLECYSTECTOMY [name] 09/29/2017   Procedure: LAPAROSCOPIC CHOLECYSTECTOMY WITH INTRAOPERATIVE CHOLANGIOGRAM;  Surgeon: Eletha Boas, MD;  Location: [facility];  Service: General;  Laterality: [name];   COLONOSCOPY WITH PROPOFOL  [name] 06/01/2023   Procedure: COLONOSCOPY WITH PROPOFOL ;  Surgeon: Legrand Victory LITTIE DOUGLAS, MD;  Location: [facility];  Service: Gastroenterology;  Laterality: [name];   DIAGNOSTIC LAPAROSCOPY     HEMOSTASIS CLIP PLACEMENT  06/01/2023   Procedure: HEMOSTASIS CLIP PLACEMENT;  Surgeon: Legrand Victory LITTIE DOUGLAS, MD;  Location: [facility];  Service: Gastroenterology;;   KNEE ARTHROSCOPY WITH MEDIAL MENISECTOMY Right 08/14/2018   Procedure: RIGHT KNEE ARTHROSCOPY WITH MEDIAL MENISECTOMY AND CHONDROPLASTY;  Surgeon: Beverley Evalene BIRCH, MD;  Location: [facility];  Service: Orthopedics;  Laterality: Right;   MOUTH SURGERY     POLYPECTOMY  06/01/2023   Procedure: POLYPECTOMY;  Surgeon: Legrand Victory LITTIE DOUGLAS, MD;  Location: [facility];  Service: Gastroenterology;;    SALPINGECTOMY  2004   Scar tissue???   WISDOM TOOTH EXTRACTION     Social History   Socioeconomic History   Marital status: Single    Spouse name: Not on file   Number of children: Not on file   Years of education: Not on file   Highest education level: Not on file  Occupational History   Not on file  Tobacco Use   Smoking status: Never    Passive exposure: Past   Smokeless tobacco: Never  Vaping Use   Vaping status: Never Used  Substance and Sexual Activity   Alcohol use: No   Drug use: No   Sexual activity: Never    Birth control/protection: None  Other Topics Concern   Not on file  Social History Narrative   Not on file   Social Drivers of Health   Financial Resource Strain: Not on file  Food Insecurity: Not on file  Transportation Needs: Not on file  Physical Activity: Not on file  Stress: Not on file  Social Connections: Unknown (12/31/2021)   Received [facility]   Social Network    Social Network: Not on file   Family History  Problem Relation Age of Onset   Diabetes Mother    Hypertension Mother    Sleep apnea Mother    Diabetes Father    Stroke Father    Hypertension Father    Hypertension Sister    Learning disabilities Cousin        autism   Anesthesia problems Neg  Hx    Hypotension Neg Hx    Malignant hyperthermia Neg Hx    Pseudochol deficiency Neg Hx    Breast cancer Neg Hx    Colon cancer Neg Hx    Colon polyps Neg Hx    Esophageal cancer Neg Hx    Rectal cancer Neg Hx    Stomach cancer Neg Hx    Allergies  Allergen Reactions   Vioxx [Rofecoxib] Hives and Rash    Patient reported it was years ago.  Pt. Says she can take ibuprofen    Clindamycin/Lincomycin Swelling    Facial swelling; allergic to anything ending in 'mycin'   Naproxen     Tramadol     Prior to Admission medications   Medication Sig Start Date End Date Taking? Authorizing Provider  losartan-hydrochlorothiazide (HYZAAR) 100-12.5 MG tablet Take 1 tablet by mouth  daily. 05/18/24  Yes [provider]  meloxicam  (MOBIC ) 15 MG tablet Take 1 tablet (15 mg total) by mouth daily. 07/09/24  Yes McDonald, Juliene SAUNDERS, DPM  Multiple Vitamin (MULTIVITAMIN ADULT PO) Take 1 tablet by mouth daily at 12 noon.   Yes [provider]  oxyCODONE -acetaminophen  (PERCOCET) 10-325 MG tablet Take 1 tablet by mouth every 6 (six) hours. 07/03/24  Yes [provider]  Vitamin D , Ergocalciferol , (DRISDOL ) 1.25 MG (50000 UNIT) CAPS capsule Take 1 capsule (50,000 Units total) by mouth every 7 (seven) days. 04/27/22  Yes Becki Krabbe, FNP  fluticasone  (FLONASE ) 50 MCG/ACT nasal spray Place 2 sprays into both nostrils daily. Patient taking differently: Place 2 sprays into both nostrils daily as needed for allergies. 04/01/21   White, Shelba SAUNDERS, NP  Ruxolitinib Phosphate  (OPZELURA ) 1.5 % CREA Apply 1 Application topically 2 (two) times daily as needed. Patient not taking: Reported on 07/09/2024 09/20/23   Iva Marty Saltness, MD     Positive ROS: All other systems have been reviewed and were otherwise negative with the exception of those mentioned in the HPI and as above.  Physical Exam: General: Alert, no acute distress Cardiovascular: No pedal edema Respiratory: No cyanosis, no use of accessory musculature GI: No organomegaly, abdomen is soft and non-tender Skin: No lesions in the area of chief complaint Neurologic: Sensation intact distally Psychiatric: Patient is competent for consent with normal mood and affect Lymphatic: No axillary or cervical lymphadenopathy  MUSCULOSKELETAL: TTP left knee medial joint line, ROM slow 10-95 degrees, NVI   Imaging: MRI of the L knee shows a moderate tear of the posterior horn and posterior body of the medial meniscus with a small displaced flap   Assessment: LEFT KNEE MEDIAL MENISCUS TEAR  Plan: Plan for Procedure(s): ARTHROSCOPY, KNEE, WITH MEDIAL MENISCECTOMY  The risks benefits and alternatives were  discussed with the patient including but not limited to the risks of nonoperative treatment, versus surgical intervention including infection, bleeding, nerve injury,  blood clots, cardiopulmonary complications, morbidity, mortality, among others, and they were willing to proceed.   Weightbearing: WBAT LLE Orthopedic devices: none Showering: POD 3 Dressing: reinforce PRN Medicines: ASA, Norco, Mobic , Zofran   Discharge: home Follow up: 08/26/24 at 8:45am with me    Gerard CHRISTELLA Large, PA-C Office [phone] 07/23/2024 1:25 PM

## 2024-07-23 NOTE — Progress Notes (Signed)
 Sent message, via epic in basket, requesting orders in epic from [facility] adviser.

## 2024-07-30 NOTE — Patient Instructions (Addendum)
 SURGICAL WAITING ROOM VISITATION  Patients having surgery or a procedure may have no more than 2 support people in the waiting area - these visitors may rotate.    Children under the age of 33 must have an adult with them who is not the patient.  Visitors with respiratory illnesses are discouraged from visiting and should remain at home.  If the patient needs to stay at the hospital during part of their recovery, the visitor guidelines for inpatient rooms apply. Pre-op nurse will coordinate an appropriate time for 1 support person to accompany patient in pre-op.  This support person may not rotate.    Please refer to the [facility] website for the visitor guidelines for Inpatients (after your surgery is over and you are in a regular room).       Your procedure is scheduled on:   08/13/2024    Report to [facility] Main Entrance    Report to admitting at   0515 AM   Call this number if you have problems the morning of surgery [phone]   Do not eat food :After Midnight.   After Midnight you may have the following liquids until __ 0430____ AM DAY OF SURGERY  Water  Non-Citrus Juices (without pulp, NO RED-Apple, White grape, White cranberry) Black Coffee (NO MILK/CREAM OR CREAMERS, sugar ok)  Clear Tea (NO MILK/CREAM OR CREAMERS, sugar ok) regular and decaf                             Plain Jell-O (NO RED)                                           Fruit ices (not with fruit pulp, NO RED)                                     Popsicles (NO RED)                                                               Sports drinks like Gatorade (NO RED)                   The day of surgery:  Drink ONE (1) Pre-Surgery Clear Ensure or G2 at 0430 AM the morning of surgery. Drink in one sitting. Do not sip.  This drink was given to you during your hospital  pre-op appointment visit. Nothing else to drink after completing the  Pre-Surgery Clear Ensure or G2.          If you have  questions, please contact your surgeon's office.       Oral Hygiene is also important to reduce your risk of infection.                                    Remember - BRUSH YOUR TEETH THE MORNING OF SURGERY WITH YOUR REGULAR TOOTHPASTE  DENTURES WILL BE REMOVED PRIOR TO SURGERY PLEASE DO NOT APPLY Poly grip OR  ADHESIVES!!!   Do NOT smoke after Midnight   Stop all vitamins and herbal supplements 7 days before surgery.   Take these medicines the morning of surgery with A SIP OF WATER :  flonase  if needed  DO NOT TAKE ANY ORAL DIABETIC MEDICATIONS DAY OF YOUR SURGERY  Bring CPAP mask and tubing day of surgery.                              You may not have any metal on your body including hair pins, jewelry, and body piercing             Do not wear make-up, lotions, powders, perfumes/cologne, or deodorant  Do not wear nail polish including gel and S&S, artificial/acrylic nails, or any other type of covering on natural nails including finger and toenails. If you have artificial nails, gel coating, etc. that needs to be removed by a nail salon please have this removed prior to surgery or surgery may need to be canceled/ delayed if the surgeon/ anesthesia feels like they are unable to be safely monitored.   Do not shave  48 hours prior to surgery.               Men may shave face and neck.   Do not bring valuables to the hospital. [facility] IS NOT             RESPONSIBLE   FOR VALUABLES.   Contacts, glasses, dentures or bridgework may not be worn into surgery.   Bring small overnight bag day of surgery.   DO NOT BRING YOUR HOME MEDICATIONS TO THE HOSPITAL. PHARMACY WILL DISPENSE MEDICATIONS LISTED ON YOUR MEDICATION LIST TO YOU DURING YOUR ADMISSION IN THE HOSPITAL!    Patients discharged on the day of surgery will not be allowed to drive home.  Someone NEEDS to stay with you for the first 24 hours after anesthesia.   Special Instructions: Bring a copy of your healthcare power  of attorney and living will documents the day of surgery if you haven't scanned them before.              Please read over the following fact sheets you were given: IF YOU HAVE QUESTIONS ABOUT YOUR PRE-OP INSTRUCTIONS PLEASE CALL [phone].   If you received a COVID test during your pre-op visit  it is requested that you wear a mask when out in public, stay away from anyone that may not be feeling well and notify your surgeon if you develop symptoms. If you test positive for Covid or have been in contact with anyone that has tested positive in the last 10 days please notify you surgeon.    [facility] - Preparing for Surgery Before surgery, you can play an important role.  Because skin is not sterile, your skin needs to be as free of germs as possible.  You can reduce the number of germs on your skin by washing with CHG (chlorahexidine gluconate) soap before surgery.  CHG is an antiseptic cleaner which kills germs and bonds with the skin to continue killing germs even after washing. Please DO NOT use if you have an allergy to CHG or antibacterial soaps.  If your skin becomes reddened/irritated stop using the CHG and inform your nurse when you arrive at Short Stay. Do not shave (including legs and underarms) for at least 48 hours prior to the first CHG shower.  You may shave your  face/neck.  Please follow these instructions carefully:  1.  Shower with CHG Soap the night before surgery ONLY (DO NOT USE THE SOAP THE MORNING OF SURGERY).  2.  If you choose to wash your hair, wash your hair first as usual with your normal  shampoo.  3.  After you shampoo, rinse your hair and body thoroughly to remove the shampoo.                             4.  Use CHG as you would any other liquid soap.  You can apply chg directly to the skin and wash.  Gently with a scrungie or clean washcloth.  5.  Apply the CHG Soap to your body ONLY FROM THE NECK DOWN.   Do   not use on face/ open                           Wound or  open sores. Avoid contact with eyes, ears mouth and   genitals (private parts).                       Wash face,  Genitals (private parts) with your normal soap.             6.  Wash thoroughly, paying special attention to the area where your    surgery  will be performed.  7.  Thoroughly rinse your body with warm water  from the neck down.  8.  DO NOT shower/wash with your normal soap after using and rinsing off the CHG Soap.                9.  Pat yourself dry with a clean towel.            10.  Wear clean pajamas.            11.  Place clean sheets on your bed the night of your first shower and do not  sleep with pets. Day of Surgery : Do not apply any CHG, lotions/deodorants the morning of surgery.  Please wear clean clothes to the hospital/surgery center.  FAILURE TO FOLLOW THESE INSTRUCTIONS MAY RESULT IN THE CANCELLATION OF YOUR SURGERY  PATIENT SIGNATURE_________________________________  NURSE SIGNATURE__________________________________  ________________________________________________________________________

## 2024-07-30 NOTE — Progress Notes (Signed)
 Anesthesia Review:  PCP: Cardiologist :  PPM/ ICD: Device Orders: Rep Notified:  Chest x-ray : EKG : Echo : Stress test: Cardiac Cath :   Activity level:  Sleep Study/ CPAP : Fasting Blood Sugar :      / Checks Blood Sugar -- times a day:    Blood Thinner/ Instructions /Last Dose: ASA / Instructions/ Last Dose :   06/21/24- colonoscopy

## 2024-07-31 ENCOUNTER — Ambulatory Visit

## 2024-07-31 ENCOUNTER — Other Ambulatory Visit: Payer: Self-pay

## 2024-07-31 ENCOUNTER — Encounter (HOSPITAL_COMMUNITY)
Admission: RE | Admit: 2024-07-31 | Discharge: 2024-07-31 | Disposition: A | Source: Ambulatory Visit | Attending: Orthopedic Surgery

## 2024-07-31 ENCOUNTER — Encounter (HOSPITAL_COMMUNITY): Payer: Self-pay

## 2024-07-31 VITALS — BP 151/81 | HR 87 | Temp 98.6°F | Resp 16 | Ht 67.0 in | Wt 371.0 lb

## 2024-07-31 DIAGNOSIS — M216X2 Other acquired deformities of left foot: Secondary | ICD-10-CM | POA: Diagnosis not present

## 2024-07-31 DIAGNOSIS — M722 Plantar fascial fibromatosis: Secondary | ICD-10-CM

## 2024-07-31 DIAGNOSIS — Z01818 Encounter for other preprocedural examination: Secondary | ICD-10-CM

## 2024-07-31 DIAGNOSIS — M216X1 Other acquired deformities of right foot: Secondary | ICD-10-CM | POA: Diagnosis not present

## 2024-07-31 LAB — BASIC METABOLIC PANEL WITH GFR
Anion gap: 8 (ref 5–15)
BUN: 10 mg/dL (ref 6–20)
CO2: 26 mmol/L (ref 22–32)
Calcium: 9.2 mg/dL (ref 8.9–10.3)
Chloride: 104 mmol/L (ref 98–111)
Creatinine, Ser: 0.72 mg/dL (ref 0.44–1.00)
GFR, Estimated: >60 mL/min (ref >60)
Glucose, Bld: 105 mg/dL — ABNORMAL HIGH (ref 70–99)
Potassium: 3.5 mmol/L (ref 3.5–5.1)
Sodium: 138 mmol/L (ref 135–145)

## 2024-07-31 LAB — CBC
HCT: 38.2 % (ref 36.0–46.0)
Hemoglobin: 12.5 g/dL (ref 12.0–15.0)
MCH: 29.5 pg (ref 26.0–34.0)
MCHC: 32.7 g/dL (ref 30.0–36.0)
MCV: 90.1 fL (ref 80.0–100.0)
Platelets: 414 K/uL — ABNORMAL HIGH (ref 150–400)
RBC: 4.24 MIL/uL (ref 3.87–5.11)
RDW: 13.5 % (ref 11.5–15.5)
WBC: 5.2 K/uL (ref 4.0–10.5)
nRBC: 0 % (ref 0.0–0.2)

## 2024-08-01 NOTE — Progress Notes (Signed)
 Visit:  ORTHOTIC SCAN/ EVALUATION  Patient presented for evaluation/ scan for custom molded foot orthotics.  Patient will benefit from custom foot orthotics to provide total contact to bilateral medial longitudinal arches to help balance and distribute body weight more evenly.  Thus reducing plantar pressure and pain.   Orthotic will encourage forefoot and rearfoot alignment.    Patient was scanned today with OHI scanner.    Orthotics are ordered.  Signature obtained for notification of pricing/ fees for the device/ notice of insurance non payment policy.  When the orthotic is ready for pick up, will call to make an appointment for a fitting.

## 2024-08-13 ENCOUNTER — Ambulatory Visit (HOSPITAL_COMMUNITY)
Admission: RE | Admit: 2024-08-13 | Discharge: 2024-08-13 | Disposition: A | Source: Ambulatory Visit | Attending: Orthopedic Surgery | Admitting: Orthopedic Surgery

## 2024-08-13 ENCOUNTER — Encounter (HOSPITAL_COMMUNITY): Payer: Self-pay | Admitting: Physician Assistant

## 2024-08-13 ENCOUNTER — Encounter (HOSPITAL_COMMUNITY): Payer: Self-pay | Admitting: Orthopedic Surgery

## 2024-08-13 ENCOUNTER — Other Ambulatory Visit: Payer: Self-pay

## 2024-08-13 ENCOUNTER — Ambulatory Visit (HOSPITAL_COMMUNITY): Payer: Self-pay | Admitting: Certified Registered"

## 2024-08-13 ENCOUNTER — Encounter (HOSPITAL_COMMUNITY): Admission: RE | Disposition: A | Payer: Self-pay | Source: Ambulatory Visit | Attending: Orthopedic Surgery

## 2024-08-13 DIAGNOSIS — X58XXXA Exposure to other specified factors, initial encounter: Secondary | ICD-10-CM | POA: Diagnosis not present

## 2024-08-13 DIAGNOSIS — M1990 Unspecified osteoarthritis, unspecified site: Secondary | ICD-10-CM | POA: Insufficient documentation

## 2024-08-13 DIAGNOSIS — S83232A Complex tear of medial meniscus, current injury, left knee, initial encounter: Secondary | ICD-10-CM | POA: Insufficient documentation

## 2024-08-13 DIAGNOSIS — E6689 Other obesity not elsewhere classified: Secondary | ICD-10-CM

## 2024-08-13 DIAGNOSIS — S83242A Other tear of medial meniscus, current injury, left knee, initial encounter: Secondary | ICD-10-CM

## 2024-08-13 DIAGNOSIS — G4730 Sleep apnea, unspecified: Secondary | ICD-10-CM | POA: Diagnosis not present

## 2024-08-13 DIAGNOSIS — Z01818 Encounter for other preprocedural examination: Secondary | ICD-10-CM

## 2024-08-13 DIAGNOSIS — Z6841 Body Mass Index (BMI) 40.0 and over, adult: Secondary | ICD-10-CM | POA: Diagnosis not present

## 2024-08-13 DIAGNOSIS — Z79899 Other long term (current) drug therapy: Secondary | ICD-10-CM | POA: Insufficient documentation

## 2024-08-13 DIAGNOSIS — I10 Essential (primary) hypertension: Secondary | ICD-10-CM | POA: Diagnosis not present

## 2024-08-13 HISTORY — PX: KNEE ARTHROSCOPY WITH MEDIAL MENISECTOMY: SHX5651

## 2024-08-13 SURGERY — ARTHROSCOPY, KNEE, WITH MEDIAL MENISCECTOMY
Anesthesia: General | Site: Knee | Laterality: Left

## 2024-08-13 MED ORDER — FENTANYL CITRATE (PF) 50 MCG/ML IJ SOSY
PREFILLED_SYRINGE | INTRAMUSCULAR | Status: AC
  Filled 2024-08-13: qty 1

## 2024-08-13 MED ORDER — PROPOFOL 10 MG/ML IV BOLUS
INTRAVENOUS | Status: DC | PRN
  Administered 2024-08-13: 07:00:00 200 mg via INTRAVENOUS

## 2024-08-13 MED ORDER — ASPIRIN 81 MG PO TBEC: 81.0000 mg | DELAYED_RELEASE_TABLET | Freq: Two times a day (BID) | ORAL | 0 refills | Status: AC

## 2024-08-13 MED ORDER — PROPOFOL 10 MG/ML IV BOLUS
INTRAVENOUS | Status: AC
  Filled 2024-08-13: qty 20

## 2024-08-13 MED ORDER — FENTANYL CITRATE (PF) 100 MCG/2ML IJ SOLN
INTRAMUSCULAR | Status: AC
  Filled 2024-08-13: qty 2

## 2024-08-13 MED ORDER — CHLORHEXIDINE GLUCONATE 0.12 % MT SOLN
15.0000 mL | Freq: Once | OROMUCOSAL | Status: AC
  Administered 2024-08-13: 06:00:00 15 mL via OROMUCOSAL

## 2024-08-13 MED ORDER — DROPERIDOL 2.5 MG/ML IJ SOLN: 0.6250 mg | Freq: Once | INTRAMUSCULAR | Status: DC | PRN

## 2024-08-13 MED ORDER — FENTANYL CITRATE (PF) 50 MCG/ML IJ SOSY
25.0000 ug | PREFILLED_SYRINGE | INTRAMUSCULAR | Status: DC | PRN
  Administered 2024-08-13: 08:00:00 25 ug via INTRAVENOUS
  Administered 2024-08-13: 09:00:00 50 ug via INTRAVENOUS
  Administered 2024-08-13: 08:00:00 25 ug via INTRAVENOUS

## 2024-08-13 MED ORDER — ROCURONIUM BROMIDE 10 MG/ML (PF) SYRINGE
PREFILLED_SYRINGE | INTRAVENOUS | Status: DC | PRN
  Administered 2024-08-13: 07:00:00 50 mg via INTRAVENOUS

## 2024-08-13 MED ORDER — LIDOCAINE HCL (CARDIAC) PF 100 MG/5ML IV SOSY
PREFILLED_SYRINGE | INTRAVENOUS | Status: DC | PRN
  Administered 2024-08-13: 07:00:00 40 mg via INTRAVENOUS

## 2024-08-13 MED ORDER — CEFAZOLIN SODIUM-DEXTROSE 3-4 GM/150ML-% IV SOLN
3.0000 g | INTRAVENOUS | Status: AC
  Administered 2024-08-13: 07:00:00 3 g via INTRAVENOUS
  Filled 2024-08-13: qty 150

## 2024-08-13 MED ORDER — SODIUM CHLORIDE 0.9 % IR SOLN
Status: DC | PRN
  Administered 2024-08-13: 08:00:00 3000 mL

## 2024-08-13 MED ORDER — LACTATED RINGERS IV SOLN: INTRAVENOUS | Status: DC

## 2024-08-13 MED ORDER — OXYCODONE HCL 5 MG PO TABS
ORAL_TABLET | ORAL | Status: AC
  Filled 2024-08-13: qty 1

## 2024-08-13 MED ORDER — MIDAZOLAM HCL 2 MG/2ML IJ SOLN
INTRAMUSCULAR | Status: AC
  Filled 2024-08-13: qty 2

## 2024-08-13 MED ORDER — BUPIVACAINE-EPINEPHRINE (PF) 0.5% -1:200000 IJ SOLN
INTRAMUSCULAR | Status: AC
  Filled 2024-08-13: qty 30

## 2024-08-13 MED ORDER — SUGAMMADEX SODIUM 200 MG/2ML IV SOLN
INTRAVENOUS | Status: DC | PRN
  Administered 2024-08-13: 08:00:00 350 mg via INTRAVENOUS

## 2024-08-13 MED ORDER — OXYCODONE HCL 5 MG PO TABS
5.0000 mg | ORAL_TABLET | Freq: Once | ORAL | Status: AC | PRN
  Administered 2024-08-13: 09:00:00 5 mg via ORAL

## 2024-08-13 MED ORDER — ORAL CARE MOUTH RINSE: 15.0000 mL | Freq: Once | OROMUCOSAL | Status: AC

## 2024-08-13 MED ORDER — HYDROCODONE-ACETAMINOPHEN 10-325 MG PO TABS: 1.0000 | ORAL_TABLET | Freq: Three times a day (TID) | ORAL | 0 refills | Status: AC | PRN

## 2024-08-13 MED ORDER — MIDAZOLAM HCL (PF) 2 MG/2ML IJ SOLN
INTRAMUSCULAR | Status: DC | PRN
  Administered 2024-08-13: 07:00:00 2 mg via INTRAVENOUS

## 2024-08-13 MED ORDER — ONDANSETRON HCL 4 MG/2ML IJ SOLN
INTRAMUSCULAR | Status: AC
  Filled 2024-08-13: qty 2

## 2024-08-13 MED ORDER — ACETAMINOPHEN 500 MG PO TABS
1000.0000 mg | ORAL_TABLET | Freq: Once | ORAL | Status: AC
  Administered 2024-08-13: 06:00:00 1000 mg via ORAL
  Filled 2024-08-13: qty 2

## 2024-08-13 MED ORDER — METHYLPREDNISOLONE ACETATE 40 MG/ML IJ SUSP
INTRAMUSCULAR | Status: AC
  Filled 2024-08-13: qty 2

## 2024-08-13 MED ORDER — ONDANSETRON 4 MG PO TBDP: 4.0000 mg | ORAL_TABLET | Freq: Three times a day (TID) | ORAL | 0 refills | Status: AC | PRN

## 2024-08-13 MED ORDER — ONDANSETRON HCL 4 MG/2ML IJ SOLN
INTRAMUSCULAR | Status: DC | PRN
  Administered 2024-08-13: 08:00:00 4 mg via INTRAVENOUS

## 2024-08-13 MED ORDER — DEXAMETHASONE SOD PHOSPHATE PF 10 MG/ML IJ SOLN
INTRAMUSCULAR | Status: DC | PRN
  Administered 2024-08-13: 08:00:00 8 mg via INTRAVENOUS

## 2024-08-13 MED ORDER — ROCURONIUM BROMIDE 10 MG/ML (PF) SYRINGE
PREFILLED_SYRINGE | INTRAVENOUS | Status: AC
  Filled 2024-08-13: qty 10

## 2024-08-13 MED ORDER — FENTANYL CITRATE (PF) 100 MCG/2ML IJ SOLN
INTRAMUSCULAR | Status: DC | PRN
  Administered 2024-08-13: 08:00:00 50 ug via INTRAVENOUS
  Administered 2024-08-13: 08:00:00 25 ug via INTRAVENOUS
  Administered 2024-08-13 (×2): 50 ug via INTRAVENOUS
  Administered 2024-08-13: 08:00:00 25 ug via INTRAVENOUS

## 2024-08-13 MED ORDER — SUGAMMADEX SODIUM 200 MG/2ML IV SOLN
INTRAVENOUS | Status: AC
  Filled 2024-08-13: qty 2

## 2024-08-13 MED ORDER — POVIDONE-IODINE 10 % EX SWAB: 2.0000 | Freq: Once | CUTANEOUS | Status: DC

## 2024-08-13 MED ORDER — OXYCODONE HCL 5 MG/5ML PO SOLN: 5.0000 mg | Freq: Once | ORAL | Status: AC | PRN

## 2024-08-13 SURGICAL SUPPLY — 31 items
BAG COUNTER SPONGE SURGICOUNT (BAG) IMPLANT
BNDG ELASTIC 6INX 5YD STR LF (GAUZE/BANDAGES/DRESSINGS) IMPLANT
BNDG ELASTIC 6X10 VLCR STRL LF (GAUZE/BANDAGES/DRESSINGS) IMPLANT
CHLORAPREP W/TINT 26 (MISCELLANEOUS) ×1 IMPLANT
DISSECTOR 3.8MM X 13CM (MISCELLANEOUS) ×1 IMPLANT
DRAPE SHEET LG 3/4 BI-LAMINATE (DRAPES) ×1 IMPLANT
DRAPE TOP 10253 STERILE (DRAPES) ×1 IMPLANT
DRAPE U-SHAPE 47X51 STRL (DRAPES) ×1 IMPLANT
DRSG EMULSION OIL 3X16 NADH (GAUZE/BANDAGES/DRESSINGS) ×1 IMPLANT
DRSG XEROFORM 1X8 (GAUZE/BANDAGES/DRESSINGS) IMPLANT
EXCALIBUR 3.8MM X 13CM (MISCELLANEOUS) ×1 IMPLANT
GAUZE 4X4 16PLY ~~LOC~~+RFID DBL (SPONGE) ×1 IMPLANT
GAUZE PAD ABD 8X10 STRL (GAUZE/BANDAGES/DRESSINGS) IMPLANT
GAUZE SPONGE 4X4 12PLY STRL (GAUZE/BANDAGES/DRESSINGS) IMPLANT
GAUZE XEROFORM 1X8 LF (GAUZE/BANDAGES/DRESSINGS) IMPLANT
GLOVE BIO SURGEON STRL SZ7.5 (GLOVE) ×1 IMPLANT
GLOVE BIOGEL PI IND STRL 7.5 (GLOVE) ×1 IMPLANT
GLOVE BIOGEL PI IND STRL 8 (GLOVE) ×1 IMPLANT
GOWN STRL REUS W/ TWL LRG LVL3 (GOWN DISPOSABLE) ×2 IMPLANT
KIT BASIN OR (CUSTOM PROCEDURE TRAY) ×1 IMPLANT
KIT TURNOVER KIT A (KITS) ×1 IMPLANT
MANIFOLD NEPTUNE II (INSTRUMENTS) IMPLANT
NS IRRIG 1000ML POUR BTL (IV SOLUTION) ×1 IMPLANT
PACK ARTHROSCOPY WL (CUSTOM PROCEDURE TRAY) ×1 IMPLANT
PADDING CAST COTTON 6X4 STRL (CAST SUPPLIES) IMPLANT
SUT ETHILON 2 0 PS N (SUTURE) IMPLANT
SUT NYLON 3 0 (SUTURE) ×1 IMPLANT
SYR 10ML LL (SYRINGE) ×1 IMPLANT
TOWEL OR 17X26 10 PK STRL BLUE (TOWEL DISPOSABLE) ×1 IMPLANT
TUBING ARTHROSCOPY IRRIG 16FT (MISCELLANEOUS) ×1 IMPLANT
WATER STERILE IRR 1000ML POUR (IV SOLUTION) ×1 IMPLANT

## 2024-08-13 NOTE — Anesthesia Procedure Notes (Signed)
 Procedure Name: Intubation Date/Time: 08/13/2024 7:26 AM  Performed by: Metta Andrea NOVAK, CRNAPre-anesthesia Checklist: Patient identified, Emergency Drugs available, Suction available, Patient being monitored and Timeout performed Patient Re-evaluated:Patient Re-evaluated prior to induction Oxygen Delivery Method: Circle system utilized Preoxygenation: Pre-oxygenation with 100% oxygen Induction Type: IV induction Ventilation: Mask ventilation without difficulty Laryngoscope Size: Mac and 4 Grade View: Grade I Tube type: Oral Tube size: 7.0 mm Number of attempts: 1 Airway Equipment and Method: Stylet Placement Confirmation: ETT inserted through vocal cords under direct vision, positive ETCO2 and breath sounds checked- equal and bilateral Secured at: 22 cm Tube secured with: Tape Dental Injury: Teeth and Oropharynx as per pre-operative assessment

## 2024-08-13 NOTE — Anesthesia Postprocedure Evaluation (Signed)
 Anesthesia Post Note  Patient: Mary Ray  Procedure(s) Performed: ARTHROSCOPY, KNEE, WITH MEDIAL MENISCECTOMY (Left: Knee)     Patient location during evaluation: PACU Anesthesia Type: General Level of consciousness: awake and alert Pain management: pain level controlled Vital Signs Assessment: post-procedure vital signs reviewed and stable Respiratory status: spontaneous breathing, nonlabored ventilation, respiratory function stable and patient connected to nasal cannula oxygen Cardiovascular status: blood pressure returned to baseline and stable Postop Assessment: no apparent nausea or vomiting Anesthetic complications: no   No notable events documented.  Last Vitals:  Vitals:   08/13/24 0815 08/13/24 0830  BP: (!) 144/70 (!) 140/68  Pulse: 88 81  Resp: 18 12  Temp:    SpO2: 98% 92%    Last Pain:  Vitals:   08/13/24 0830  TempSrc:   PainSc: 6                  [name]

## 2024-08-13 NOTE — Discharge Instructions (Addendum)

## 2024-08-13 NOTE — Interval H&P Note (Signed)
 History and Physical Interval Note:  08/13/2024 6:54 AM  Mary Ray  has presented today for surgery, with the diagnosis of LEFT KNEE MEDIAL MENISCUS TEAR.  The various methods of treatment have been discussed with the patient and family. After consideration of risks, benefits and other options for treatment, the patient has consented to  Procedures: ARTHROSCOPY, KNEE, WITH MEDIAL MENISCECTOMY (Left) as a surgical intervention.  The patient's history has been reviewed, patient examined, no change in status, stable for surgery.  I have reviewed the patient's chart and labs.  Questions were answered to the patient's satisfaction.     [name]

## 2024-08-13 NOTE — Op Note (Signed)
 08/13/2024  7:57 AM  PATIENT:  Mary Ray    PRE-OPERATIVE DIAGNOSIS:  LEFT KNEE MEDIAL MENISCUS TEAR  POST-OPERATIVE DIAGNOSIS:  Same  PROCEDURE:  ARTHROSCOPY, KNEE, WITH MEDIAL MENISCECTOMY  SURGEON:  [name], MD  ASSISTANT: Gerard Large, PA-C, he was present and scrubbed throughout the case, critical for completion in a timely fashion, and for retraction, instrumentation, and closure.   ANESTHESIA:   General  BLOOD LOSS: min  COMPLICATIONS: None   PREOPERATIVE INDICATIONS:  [name] is a  48 y.o. female with a diagnosis of LEFT KNEE MEDIAL MENISCUS TEAR who failed conservative measures and elected for surgical management.    The risks benefits and alternatives were discussed with the patient preoperatively including but not limited to the risks of infection, bleeding, nerve injury, cardiopulmonary complications, the need for revision surgery, among others, and the patient was willing to proceed.  OPERATIVE IMPLANTS: none  OPERATIVE FINDINGS: Examination under anesthesia: stable Diagnostic Arthroscopy:  articular cartilage:grade 1 PF space Medial meniscus:complex tear  Lateral meniscus:stable Anterior cruciate ligament/PCL: stable Loose bodies: none    OPERATIVE PROCEDURE:  Patient was identified in the preoperative holding area and site was marked by me female was transported to the operating theater and placed on the table in supine position taking care to pad all bony prominences. After a preincinduction time out anesthesia was induced.  female received ancef  for preoperative antibiotics. The left lower extremity was prepped and draped in normal sterile fashion and a pre-incision timeout was performed.   A small stab incision was made in the anterolateral portal position. The arthroscope was introduced in the joint. A medial portal was then established under direct visualization just above the anterior horn of the medial meniscus. Diagnostic  arthroscopy was then carried out with findings as described above.  I debrided the medial meniscal tear with a shaver and biter.   I perfromed a chondroplasty of the patellofemoral space with a shaver.   The arthroscopic equipment was removed from the joint and the portals were closed with 3-0 nylon in an interrupted fashion.  Sterile dressings were then applied including Xeroform 4 x 4's ABDs an ACE bandage.  The patient was then allowed to awaken from general anesthesia, transferred to the stretcher and taken to the recovery room in stable condition.  POSTOPERATIVE PLAN: The patient will be discharged home today and will followup in one week for suture removal and wound check.

## 2024-08-13 NOTE — Anesthesia Preprocedure Evaluation (Signed)
 Anesthesia Evaluation  Patient identified by MRN, date of birth, ID band Patient awake    Reviewed: Allergy & Precautions, NPO status , Patient's Chart, lab work & pertinent test results  History of Anesthesia Complications Negative for: history of anesthetic complications  Airway Mallampati: III  TM Distance: >3 FB Neck ROM: Full    Dental no notable dental hx. (+) Teeth Intact   Pulmonary sleep apnea , neg COPD, Patient abstained from smoking.Not current smoker   Pulmonary exam normal breath sounds clear to auscultation       Cardiovascular Exercise Tolerance: Good METShypertension, Pt. on medications (-) CAD and (-) Past MI (-) dysrhythmias  Rhythm:Regular Rate:Normal - Systolic murmurs    Neuro/Psych negative neurological ROS  negative psych ROS   GI/Hepatic ,neg GERD  ,,(+)     (-) substance abuse    Endo/Other  neg diabetes  Class 4 obesity  Renal/GU negative Renal ROS     Musculoskeletal  (+) Arthritis ,    Abdominal  (+) + obese  Peds  Hematology   Anesthesia Other Findings Past Medical History: No date: Arthritis     Comment:  Back No date: Back pain No date: High blood pressure No date: Joint pain No date: Pre-diabetes No date: Sleep apnea     Comment:  No longer uses CPAP  Reproductive/Obstetrics                              Anesthesia Physical Anesthesia Plan  ASA: 3  Anesthesia Plan: General   Post-op Pain Management: Tylenol  PO (pre-op)*   Induction: Intravenous  PONV Risk Score and Plan: 3 and Ondansetron , Dexamethasone  and Midazolam   Airway Management Planned: Oral ETT  Additional Equipment: None  Intra-op Plan:   Post-operative Plan: Extubation in OR  Informed Consent: I have reviewed the patients History and Physical, chart, labs and discussed the procedure including the risks, benefits and alternatives for the proposed anesthesia with the  patient or authorized representative who has indicated his/her understanding and acceptance.     Dental advisory given  Plan Discussed with: CRNA and Surgeon  Anesthesia Plan Comments: (Discussed risks of anesthesia with patient, including PONV, sore throat, lip/dental/eye damage. Rare risks discussed as well, such as cardiorespiratory and neurological sequelae, and allergic reactions. Discussed the role of CRNA in patient's perioperative care. Patient understands. Patient informed about increased incidence of above perioperative risk due to high BMI. Patient understands.  )        Anesthesia Quick Evaluation

## 2024-08-13 NOTE — Transfer of Care (Signed)
 Immediate Anesthesia Transfer of Care Note  Patient: Mary Ray  Procedure(s) Performed: ARTHROSCOPY, KNEE, WITH MEDIAL MENISCECTOMY (Left: Knee)  Patient Location: PACU  Anesthesia Type:General  Level of Consciousness: awake, alert , and patient cooperative  Airway & Oxygen Therapy: Patient Spontanous Breathing and Patient connected to face mask oxygen  Post-op Assessment: Report given to RN and Post -op Vital signs reviewed and stable  Post vital signs: Reviewed and stable  Last Vitals:  Vitals Value Taken Time  BP 148/73 08/13/24 08:03  Temp    Pulse 86 08/13/24 08:06  Resp 14   SpO2 100 % 08/13/24 08:06  Vitals shown include unfiled device data.  Last Pain:  Vitals:   08/13/24 0623  TempSrc:   PainSc: 0-No pain         Complications: No notable events documented.

## 2024-08-14 ENCOUNTER — Encounter (HOSPITAL_COMMUNITY): Payer: Self-pay | Admitting: Orthopedic Surgery

## 2024-08-20 ENCOUNTER — Ambulatory Visit: Admitting: Podiatry

## 2024-08-20 ENCOUNTER — Ambulatory Visit

## 2024-10-02 ENCOUNTER — Emergency Department (HOSPITAL_BASED_OUTPATIENT_CLINIC_OR_DEPARTMENT_OTHER)
Admission: EM | Admit: 2024-10-02 | Discharge: 2024-10-02 | Disposition: A | Discharge status: Home / Self Care | Attending: Emergency Medicine | Admitting: Emergency Medicine

## 2024-10-02 ENCOUNTER — Encounter (HOSPITAL_BASED_OUTPATIENT_CLINIC_OR_DEPARTMENT_OTHER): Payer: Self-pay

## 2024-10-02 DIAGNOSIS — Z7982 Long term (current) use of aspirin: Secondary | ICD-10-CM | POA: Insufficient documentation

## 2024-10-02 DIAGNOSIS — M545 Low back pain, unspecified: Secondary | ICD-10-CM | POA: Insufficient documentation

## 2024-10-02 MED ORDER — METHOCARBAMOL 500 MG PO TABS: 1000.0000 mg | ORAL_TABLET | Freq: Three times a day (TID) | ORAL | 0 refills | Status: AC | PRN

## 2024-10-02 MED ORDER — LIDOCAINE 5 % EX PTCH
1.0000 | MEDICATED_PATCH | Freq: Once | CUTANEOUS | Status: DC
  Administered 2024-10-02: 1 via TRANSDERMAL
  Filled 2024-10-02: qty 1

## 2024-10-02 MED ORDER — LIDOCAINE 5 % EX PTCH: 1.0000 | MEDICATED_PATCH | CUTANEOUS | 0 refills | Status: AC

## 2024-10-02 MED ORDER — KETOROLAC TROMETHAMINE 15 MG/ML IJ SOLN
15.0000 mg | Freq: Once | INTRAMUSCULAR | Status: AC
  Administered 2024-10-02: 15 mg via INTRAMUSCULAR
  Filled 2024-10-02: qty 1

## 2024-10-02 NOTE — Discharge Instructions (Signed)
 Please read and follow all provided instructions.  Your diagnoses today include:  1. Acute right-sided low back pain without sciatica     Tests performed today include: Vital signs - see below for your results today  Medications prescribed:  Robaxin  (methocarbamol ) - muscle relaxer medication  DO NOT drive or perform any activities that require you to be awake and alert because this medicine can make you drowsy.   Lidocaine  patch  Take any prescribed medications only as directed.  Home care instructions:  Follow any educational materials contained in this packet Please rest, use ice or heat on your back for the next several days Do not lift, push, pull anything more than 10 pounds for the next week  Follow-up instructions: Please follow-up with your primary care provider in the next 1 week for further evaluation of your symptoms.   Return instructions:  SEEK IMMEDIATE MEDICAL ATTENTION IF YOU HAVE: New numbness, tingling, weakness, or problem with the use of your arms or legs Severe back pain not relieved with medications Loss control of your bowels or bladder Increasing pain in any areas of the body (such as chest or abdominal pain) Shortness of breath, dizziness, or fainting.  Worsening nausea (feeling sick to your stomach), vomiting, fever, or sweats Any other emergent concerns regarding your health   Additional Information:  Your vital signs today were: BP (!) 157/92 (BP Location: Right Wrist)   Pulse 91   Temp 97.6 F (36.4 C)   Resp 18   LMP 09/28/2015   SpO2 97%  If your blood pressure (BP) was elevated above 135/85 this visit, please have this repeated by your doctor within one month. --------------

## 2024-10-02 NOTE — ED Triage Notes (Signed)
 Patient reports being at work when she started having lower back pain feeling like someone was stabbing her or poking her in the lower back. Patient reports this is the 3rd time this has happened but it is worse. She says that usually she can stretch it out but this time it has not been working. Denies urinary or bowel issues.

## 2024-10-02 NOTE — ED Notes (Signed)
 DC paperwork given and verbally understood.

## 2024-10-02 NOTE — ED Provider Notes (Signed)
 " [facility] AT [facility] Provider Note   CSN: [id number] Arrival date & time: 10/02/24  8161     Patient presents with: Back Pain   Mary Ray is a 49 y.o. female.   Patient presents to the emergency department today for evaluation of low back pain.  Patient was at her job at [employer], reached across her body, and developed immediate pain in her lower back, slightly favoring the right side.  Pain is worse with movement and palpation.  She has had short-lived spells of similar pain, resolved with gentle stretching.  She came right from work to the emergency department tonight, drove herself.  Patient denies warning symptoms of back pain including: fecal incontinence, urinary retention or overflow incontinence, night sweats, waking from sleep with back pain, unexplained fevers or weight loss, h/o cancer, IVDU, recent trauma.    MRI from 6 years ago showed mild disc bulging in the lower spine.  Mainly towards the left side.       Prior to Admission medications  Medication Sig Start Date End Date Taking? Authorizing Provider  aspirin  EC 81 MG tablet Take 1 tablet (81 mg total) by mouth 2 (two) times daily. To prevent blood clots for 30 days after surgery. 08/13/24   Gawne, Meghan M, PA-C  fluticasone  (FLONASE ) 50 MCG/ACT nasal spray Place 2 sprays into both nostrils daily. Patient taking differently: Place 2 sprays into both nostrils daily as needed for allergies. 04/01/21   White, Shelba SAUNDERS, NP  HYDROcodone -acetaminophen  (NORCO) 10-325 MG tablet Take 1 tablet by mouth every 8 (eight) hours as needed for severe pain (pain score 7-10). in knee after surgery that is not controlled by first taking your normal daily dose of Percocet for chronic pain 08/13/24   Gawne, Meghan M, PA-C  losartan-hydrochlorothiazide (HYZAAR) 100-12.5 MG tablet Take 1 tablet by mouth daily. 05/18/24   [provider]  meloxicam  (MOBIC ) 15 MG tablet Take 1 tablet (15 mg total)  by mouth daily. 07/09/24   McDonald, Juliene SAUNDERS, DPM  Multiple Vitamin (MULTIVITAMIN ADULT PO) Take 1 tablet by mouth daily at 12 noon.    [provider]  ondansetron  (ZOFRAN -ODT) 4 MG disintegrating tablet Take 1 tablet (4 mg total) by mouth every 8 (eight) hours as needed for nausea or vomiting. 08/13/24   Gawne, Meghan M, PA-C  oxyCODONE -acetaminophen  (PERCOCET) 10-325 MG tablet Take 1 tablet by mouth every 6 (six) hours. 07/03/24   [provider]  Ruxolitinib Phosphate  (OPZELURA ) 1.5 % CREA Apply 1 Application topically 2 (two) times daily as needed. Patient not taking: Reported on 07/09/2024 09/20/23   Iva Marty Saltness, MD  Vitamin D , Ergocalciferol , (DRISDOL ) 1.25 MG (50000 UNIT) CAPS capsule Take 1 capsule (50,000 Units total) by mouth every 7 (seven) days. 04/27/22   Becki Krabbe, FNP    Allergies: Vioxx [rofecoxib], Clindamycin/lincomycin, Naproxen , and Tramadol     Review of Systems  Updated Vital Signs BP (!) 157/92 (BP Location: Right Wrist)   Pulse 91   Temp 97.6 F (36.4 C)   Resp 18   LMP 09/28/2015   SpO2 97%   Physical Exam Vitals and nursing note reviewed.  Constitutional:      Appearance: She is well-developed.  HENT:     Head: Normocephalic and atraumatic.     Nose: Nose normal.     Mouth/Throat:     Mouth: Mucous membranes are moist.  Eyes:     Conjunctiva/sclera: Conjunctivae normal.  Pulmonary:     Effort:  Pulmonary effort is normal.  Abdominal:     Palpations: Abdomen is soft.     Tenderness: There is no abdominal tenderness.  Musculoskeletal:        General: Normal range of motion.     Cervical back: Normal range of motion and neck supple. No tenderness or bony tenderness.     Thoracic back: No tenderness or bony tenderness.     Lumbar back: Tenderness present. No bony tenderness.       Back:     Comments: No step-off noted with palpation of spine.   Skin:    General: Skin is warm and dry.     Findings: No rash.   Neurological:     Mental Status: She is alert.     Sensory: No sensory deficit.     Comments: 5/5 strength in entire lower extremities bilaterally. No sensation deficit.      (all labs ordered are listed, but only abnormal results are displayed) Labs Reviewed - No data to display  EKG: None  Radiology: No results found.   Procedures   Medications Ordered in the ED  ketorolac  (TORADOL ) 15 MG/ML injection 15 mg (has no administration in time range)  lidocaine  (LIDODERM ) 5 % 1 patch (has no administration in time range)   ED Course  Patient seen and examined. History obtained directly from patient.    Labs/EKG: None ordered.  Imaging: None ordered. Considering imaging of the lumbar spine with x-ray or CT but no red flags today and feel this would be low yield.   Medications/Fluids: IM Toradol , Lidoderm  patch  Most recent vital signs reviewed and are as follows: BP (!) 157/92 (BP Location: Right Wrist)   Pulse 91   Temp 97.6 F (36.4 C)   Resp 18   LMP 09/28/2015   SpO2 97%   Initial impression: Low back pain without radicular features  Home treatment plan: Patient was counseled on back pain precautions and advised to do activity as tolerated but avoid strenuous activity and do not lift, push, or pull heavy objects more than 10 pounds for the next week.  Patient counseled to use ice or heat on back as needed for pain and spasm.    Medications prescribed:  Robaxin  for muscle pain and spasm. Patient counseled on proper use of muscle relaxant medication.  They were requested not to drink alcohol, drive any vehicle, or do any dangerous activities while taking this medication due to potential drowsiness and unintended.  Patient verbalized understanding.  Return instructions discussed with patient: Urged to return with worsening severe pain, loss of bowel or bladder control, trouble walking, development of weakness in the legs, or with any other concerns.  Follow-up  instructions discussed with patient: Patient urged to follow-up with PCP if pain does not improve with treatment and rest or if pain becomes recurrent.                                   Medical Decision Making Risk Prescription drug management.   Patient with back pain. No neurological deficits. Patient is ambulatory. No warning symptoms of back pain including: fecal incontinence, urinary retention or overflow incontinence, night sweats, waking from sleep with back pain, unexplained fevers or weight loss, h/o cancer, IVDU, recent trauma. No concern for cauda equina, epidural abscess, or other serious cause of back pain. Conservative measures such as rest, ice/heat and pain medicine indicated with PCP follow-up if  no improvement with conservative management.       Final diagnoses:  Acute right-sided low back pain without sciatica    ED Discharge Orders     None          Desiderio Chew, DEVONNA 10/02/24 2046    Doretha Folks, MD 10/02/24 2153  "
